# Patient Record
Sex: Male | Born: 1990
Health system: Southern US, Community
[De-identification: ages and names within clinical notes are randomized; demographics above are authoritative.]

## PROBLEM LIST (undated history)

## (undated) DIAGNOSIS — I471 Supraventricular tachycardia, unspecified: Secondary | ICD-10-CM

## (undated) DIAGNOSIS — M549 Dorsalgia, unspecified: Secondary | ICD-10-CM

## (undated) DIAGNOSIS — G8929 Other chronic pain: Secondary | ICD-10-CM

## (undated) DIAGNOSIS — IMO0002 Reserved for concepts with insufficient information to code with codable children: Secondary | ICD-10-CM

## (undated) DIAGNOSIS — I493 Ventricular premature depolarization: Secondary | ICD-10-CM

## (undated) HISTORY — DX: Ventricular premature depolarization: I49.3

## (undated) HISTORY — DX: Supraventricular tachycardia: I47.1

## (undated) HISTORY — DX: Supraventricular tachycardia, unspecified: I47.10

---

## 2009-03-09 ENCOUNTER — Emergency Department (HOSPITAL_BASED_OUTPATIENT_CLINIC_OR_DEPARTMENT_OTHER): Admission: EM | Admit: 2009-03-09 | Discharge: 2009-03-10 | Payer: Self-pay | Admitting: Emergency Medicine

## 2009-03-09 ENCOUNTER — Ambulatory Visit: Payer: Self-pay | Admitting: Diagnostic Radiology

## 2009-07-18 ENCOUNTER — Other Ambulatory Visit: Payer: Self-pay | Admitting: Emergency Medicine

## 2009-07-18 ENCOUNTER — Emergency Department (HOSPITAL_COMMUNITY): Admission: EM | Admit: 2009-07-18 | Discharge: 2009-07-18 | Payer: Self-pay | Admitting: Emergency Medicine

## 2009-07-29 ENCOUNTER — Emergency Department (HOSPITAL_BASED_OUTPATIENT_CLINIC_OR_DEPARTMENT_OTHER): Admission: EM | Admit: 2009-07-29 | Discharge: 2009-07-29 | Payer: Self-pay | Admitting: Emergency Medicine

## 2009-08-18 ENCOUNTER — Emergency Department (HOSPITAL_BASED_OUTPATIENT_CLINIC_OR_DEPARTMENT_OTHER): Admission: EM | Admit: 2009-08-18 | Discharge: 2009-08-18 | Payer: Self-pay | Admitting: Emergency Medicine

## 2010-10-01 LAB — BASIC METABOLIC PANEL
BUN: 9 mg/dL (ref 6–23)
CO2: 25 mEq/L (ref 19–32)
Calcium: 9.5 mg/dL (ref 8.4–10.5)
Chloride: 104 mEq/L (ref 96–112)
Creatinine, Ser: 0.7 mg/dL (ref 0.4–1.5)
GFR calc Af Amer: >60 mL/min (ref >60)
GFR calc non Af Amer: >60 mL/min (ref >60)
Glucose, Bld: 73 mg/dL (ref 70–99)
Potassium: 4.4 mEq/L (ref 3.5–5.1)
Sodium: 141 mEq/L (ref 135–145)

## 2010-10-01 LAB — CULTURE, BLOOD (ROUTINE X 2)
Culture: NO GROWTH
Culture: NO GROWTH

## 2010-10-01 LAB — DIFFERENTIAL
Basophils Relative: 1 % (ref 0–1)
Lymphs Abs: 2.9 10*3/uL (ref 0.7–4.0)
Monocytes Absolute: 0.7 10*3/uL (ref 0.1–1.0)
Neutro Abs: 5.2 10*3/uL (ref 1.7–7.7)

## 2010-10-01 LAB — CBC
HCT: 44.8 % (ref 39.0–52.0)
MCV: 90.1 fL (ref 78.0–100.0)
Platelets: 260 10*3/uL (ref 150–400)
RBC: 4.97 MIL/uL (ref 4.22–5.81)

## 2010-10-01 LAB — SEDIMENTATION RATE: Sed Rate: 5 mm/hr (ref 0–16)

## 2010-11-12 ENCOUNTER — Emergency Department (HOSPITAL_COMMUNITY)
Admission: EM | Admit: 2010-11-12 | Discharge: 2010-11-12 | Disposition: A | Discharge status: Home / Self Care | Payer: Self-pay | Attending: Emergency Medicine | Admitting: Emergency Medicine

## 2010-11-12 DIAGNOSIS — F17200 Nicotine dependence, unspecified, uncomplicated: Secondary | ICD-10-CM | POA: Insufficient documentation

## 2010-11-12 DIAGNOSIS — K625 Hemorrhage of anus and rectum: Secondary | ICD-10-CM | POA: Insufficient documentation

## 2010-11-12 LAB — DIFFERENTIAL
Eosinophils Relative: 1 % (ref 0–5)
Lymphocytes Relative: 33 % (ref 12–46)
Monocytes Absolute: 0.6 10*3/uL (ref 0.1–1.0)

## 2010-11-12 LAB — CBC
HCT: 43.8 % (ref 39.0–52.0)
Hemoglobin: 15.6 g/dL (ref 13.0–17.0)
MCHC: 35.6 g/dL (ref 30.0–36.0)
MCV: 86.6 fL (ref 78.0–100.0)
Platelets: 224 10*3/uL (ref 150–400)
RBC: 5.06 MIL/uL (ref 4.22–5.81)

## 2010-11-12 LAB — OCCULT BLOOD, POC DEVICE: Fecal Occult Bld: NEGATIVE

## 2011-02-23 ENCOUNTER — Emergency Department (HOSPITAL_BASED_OUTPATIENT_CLINIC_OR_DEPARTMENT_OTHER)
Admission: EM | Admit: 2011-02-23 | Discharge: 2011-02-23 | Disposition: A | Discharge status: Home / Self Care | Payer: Self-pay | Attending: Emergency Medicine | Admitting: Emergency Medicine

## 2011-02-23 ENCOUNTER — Encounter: Payer: Self-pay | Admitting: *Deleted

## 2011-02-23 DIAGNOSIS — F17200 Nicotine dependence, unspecified, uncomplicated: Secondary | ICD-10-CM | POA: Insufficient documentation

## 2011-02-23 DIAGNOSIS — T6391XA Toxic effect of contact with unspecified venomous animal, accidental (unintentional), initial encounter: Secondary | ICD-10-CM | POA: Insufficient documentation

## 2011-02-23 DIAGNOSIS — T63461A Toxic effect of venom of wasps, accidental (unintentional), initial encounter: Secondary | ICD-10-CM | POA: Insufficient documentation

## 2011-02-23 DIAGNOSIS — T63441A Toxic effect of venom of bees, accidental (unintentional), initial encounter: Secondary | ICD-10-CM

## 2011-02-23 HISTORY — DX: Reserved for concepts with insufficient information to code with codable children: IMO0002

## 2011-02-23 MED ORDER — ALBUTEROL SULFATE (5 MG/ML) 0.5% IN NEBU
INHALATION_SOLUTION | RESPIRATORY_TRACT | Status: AC
  Filled 2011-02-23: qty 1

## 2011-02-23 MED ORDER — METHYLPREDNISOLONE SODIUM SUCC 125 MG IJ SOLR
INTRAMUSCULAR | Status: AC
  Filled 2011-02-23: qty 2

## 2011-02-23 MED ORDER — DIPHENHYDRAMINE HCL 50 MG/ML IJ SOLN
INTRAMUSCULAR | Status: AC
  Filled 2011-02-23: qty 1

## 2011-02-23 MED ORDER — EPINEPHRINE 0.3 MG/0.3ML IJ DEVI: 0.3000 mg | INTRAMUSCULAR | Status: DC | PRN

## 2011-02-23 MED ORDER — EPINEPHRINE HCL 1 MG/ML IJ SOLN
INTRAMUSCULAR | Status: AC
  Filled 2011-02-23: qty 1

## 2011-02-23 MED ORDER — IPRATROPIUM BROMIDE 0.02 % IN SOLN
RESPIRATORY_TRACT | Status: AC
  Filled 2011-02-23: qty 2.5

## 2011-02-23 NOTE — ED Notes (Signed)
Pt lying on stretcher talking to friend at bedside. Pt is CAO and in NAD. Pt denies return of any prior symptoms and denies difficulty swallowing or SOB.

## 2011-02-23 NOTE — ED Provider Notes (Signed)
History     CSN: [id number] Arrival date & time: 02/23/2011  2:07 AM  Chief Complaint  Patient presents with  . Insect Bite   HPI  this is a 20 year old male who was bitten by a bee just prior to arrival. He developed difficulty breathing throat swelling and itching and pain at the site. The site was on his left lateral lower leg. EMS was called and they treated him with 0.3 mg of subcutaneous epinephrine, 50 mg of Benadryl IV, 125 mg of Solu-Medrol IV, and 2 albuterol nebulizer breathing treatments. These resulted in almost complete relief of his symptoms. At this time he is asymptomatic and ready to go home. The symptoms a thorough work for moderate to severe. He requests a prescription for an epinephrine pen to carry with him.  Past Medical History  Diagnosis Date  . Herniated disc     History reviewed. No pertinent past surgical history.  No family history on file.  History  Substance Use Topics  . Smoking status: Current Everyday Smoker  . Smokeless tobacco: Not on file  . Alcohol Use: Yes      Review of Systems  All other systems reviewed and are negative.    Physical Exam  BP 123/74  Pulse 103  Temp(Src) 98.4 F (36.9 C) (Oral)  Resp 20  Ht 6\' 1"  (1.854 m)  Wt 201 lb (91.173 kg)  BMI 26.52 kg/m2  SpO2 99%  Physical Exam General: Well-developed, well-nourished male in no acute distress; appearance consistent with age of record HENT: normocephalic, atraumatic; oropharynx normal Eyes: pupils equal round and reactive to light; extraocular muscles intact Neck: supple Heart: regular rate and rhythm; no murmurs, rubs or gallops Lungs: clear to auscultation bilaterally Abdomen: soft; nontender; nondistended; no masses or hepatosplenomegaly; bowel sounds present Extremities: No deformity; full range of motion; pulses normal Neurologic: Awake, alert and oriented;motor function intact in all extremities and symmetric;sensation grossly intact; no facial droop Skin:  Warm and dry Psychiatric: Normal mood and affect   ED Course  Procedures  MDM       Hanley Seamen, MD 02/23/11 [phone]

## 2011-02-23 NOTE — ED Notes (Signed)
Pt was on his front porch smoking and swatted at an insect on his leg that then bit/stung him. EMS sts upon their arrival, pt was lying in the floor nearly unresponsive and wheezing. EMS placed an 18 ga in his LAC and administered 0.3mg  Epinephrine, breathing treatments x2, 50mg  Benadryl, 125mg  Solu-medrol and 50mg  Zantac. Pt CAO and in no distress upon arrival to the ER.

## 2011-06-04 ENCOUNTER — Encounter (HOSPITAL_BASED_OUTPATIENT_CLINIC_OR_DEPARTMENT_OTHER): Payer: Self-pay | Admitting: Emergency Medicine

## 2011-06-04 ENCOUNTER — Emergency Department (HOSPITAL_BASED_OUTPATIENT_CLINIC_OR_DEPARTMENT_OTHER)
Admission: EM | Admit: 2011-06-04 | Discharge: 2011-06-04 | Disposition: A | Discharge status: Home / Self Care | Payer: Self-pay | Attending: Emergency Medicine | Admitting: Emergency Medicine

## 2011-06-04 DIAGNOSIS — J111 Influenza due to unidentified influenza virus with other respiratory manifestations: Secondary | ICD-10-CM | POA: Insufficient documentation

## 2011-06-04 DIAGNOSIS — R059 Cough, unspecified: Secondary | ICD-10-CM | POA: Insufficient documentation

## 2011-06-04 DIAGNOSIS — R05 Cough: Secondary | ICD-10-CM | POA: Insufficient documentation

## 2011-06-04 DIAGNOSIS — R509 Fever, unspecified: Secondary | ICD-10-CM | POA: Insufficient documentation

## 2011-06-04 LAB — RAPID STREP SCREEN (MED CTR MEBANE ONLY): Streptococcus, Group A Screen (Direct): NEGATIVE

## 2011-06-04 MED ORDER — ACETAMINOPHEN 325 MG PO TABS
650.0000 mg | ORAL_TABLET | Freq: Once | ORAL | Status: AC
  Administered 2011-06-04: 650 mg via ORAL
  Filled 2011-06-04: qty 2

## 2011-06-04 NOTE — ED Notes (Signed)
Dr. Wickline at bedside.  

## 2011-06-04 NOTE — ED Notes (Signed)
Pt reports sore throat/nasal congestion/sinus headache/generalized body aches/abdominal pains with vomiting since last night at 3am. Sudden onset. Progressively worsening throughout the day. Taking ibuprofen without relief of aches. Fever as high as 101.8 at home. Pt states it started with the sore throat first.

## 2011-06-04 NOTE — ED Notes (Signed)
Pt with fever, cough and vomiting.

## 2011-06-05 NOTE — ED Provider Notes (Signed)
History     CSN: [id number] Arrival date & time: 06/04/2011 10:55 PM   First MD Initiated Contact with Patient 06/04/11 2330      Chief Complaint  Patient presents with  . Fever  . Emesis  . Cough     Patient is a 20 y.o. male presenting with fever, vomiting, and cough. The history is provided by the patient.  Fever Primary symptoms of the febrile illness include fever, fatigue, headaches, cough, nausea, vomiting and myalgias. Primary symptoms do not include shortness of breath, altered mental status or rash. The current episode started yesterday. This is a new problem. The problem has been gradually worsening.  Associated with: nothing. Risk factors: none. Emesis  Associated symptoms include cough, a fever, headaches and myalgias.  Cough Associated symptoms include headaches and myalgias. Pertinent negatives include no shortness of breath.    Past Medical History  Diagnosis Date  . Herniated disc     History reviewed. No pertinent past surgical history.  No family history on file.  History  Substance Use Topics  . Smoking status: Current Everyday Smoker  . Smokeless tobacco: Not on file  . Alcohol Use: Yes      Review of Systems  Constitutional: Positive for fever and fatigue.  Respiratory: Positive for cough. Negative for shortness of breath.   Gastrointestinal: Positive for nausea and vomiting.  Musculoskeletal: Positive for myalgias.  Skin: Negative for rash.  Neurological: Positive for headaches.  Psychiatric/Behavioral: Negative for altered mental status.    Allergies  Bee venom and Pineapple  Home Medications   Current Outpatient Rx  Name Route Sig Dispense Refill  . ASPIRIN 325 MG PO TABS Oral Take 650 mg by mouth once.      Dorothyann Peng MULTI-SYMPTOM COLD/FLU PO Oral Take 2 capsules by mouth every 6 (six) hours as needed. For congestion     . EPINEPHRINE 0.3 MG/0.3ML IJ DEVI Intramuscular Inject 0.3 mg into the muscle as needed. For allergic  reaction       BP 129/71  Pulse 115  Temp(Src) 99.8 F (37.7 C) (Oral)  Resp 18  SpO2 96%  Physical Exam  CONSTITUTIONAL: Well developed/well nourished HEAD AND FACE: Normocephalic/atraumatic EYES: EOMI/PERRL ENMT: Mucous membranes moist, uvula midline, pharynx normal NECK: supple no meningeal signs CV: S1/S2 noted, no murmurs/rubs/gallops noted LUNGS: Lungs are clear to auscultation bilaterally, no apparent distress ABDOMEN: soft, nontender, no rebound or guarding NEURO: Pt is awake/alert, moves all extremitiesx4 EXTREMITIES: pulses normal, full ROM SKIN: warm, color normal PSYCH: no abnormalities of mood noted   ED Course  Procedures    Labs Reviewed  RAPID STREP SCREEN     1. Influenza       MDM  Nursing notes reviewed and considered in documentation All labs/vitals reviewed and considered   Pt with fever/cough/myalgias, but well appearing, no distress. Suspect influenza.  Stable for outpatient management        Joya Gaskins, MD 06/05/11 0126

## 2011-06-07 ENCOUNTER — Emergency Department (HOSPITAL_BASED_OUTPATIENT_CLINIC_OR_DEPARTMENT_OTHER)
Admission: EM | Admit: 2011-06-07 | Discharge: 2011-06-07 | Disposition: A | Discharge status: Home / Self Care | Payer: Self-pay | Attending: Emergency Medicine | Admitting: Emergency Medicine

## 2011-06-07 ENCOUNTER — Emergency Department (INDEPENDENT_AMBULATORY_CARE_PROVIDER_SITE_OTHER): Payer: Self-pay

## 2011-06-07 ENCOUNTER — Encounter (HOSPITAL_BASED_OUTPATIENT_CLINIC_OR_DEPARTMENT_OTHER): Payer: Self-pay | Admitting: *Deleted

## 2011-06-07 DIAGNOSIS — J189 Pneumonia, unspecified organism: Secondary | ICD-10-CM | POA: Insufficient documentation

## 2011-06-07 DIAGNOSIS — R509 Fever, unspecified: Secondary | ICD-10-CM

## 2011-06-07 DIAGNOSIS — R042 Hemoptysis: Secondary | ICD-10-CM

## 2011-06-07 DIAGNOSIS — F17200 Nicotine dependence, unspecified, uncomplicated: Secondary | ICD-10-CM | POA: Insufficient documentation

## 2011-06-07 MED ORDER — HYDROCOD POLST-CHLORPHEN POLST 10-8 MG/5ML PO LQCR: 5.0000 mL | Freq: Two times a day (BID) | ORAL | Status: DC | PRN

## 2011-06-07 MED ORDER — AZITHROMYCIN 250 MG PO TABS: 250.0000 mg | ORAL_TABLET | Freq: Every day | ORAL | Status: AC

## 2011-06-07 NOTE — ED Notes (Signed)
Patient transported to X-ray 

## 2011-06-07 NOTE — ED Notes (Signed)
Pt c/o severe coughing seen here Monday dx flu

## 2011-06-07 NOTE — ED Provider Notes (Signed)
History     CSN: [id number] Arrival date & time: 06/07/2011  5:42 PM   First MD Initiated Contact with Patient 06/07/11 1744      Chief Complaint  Patient presents with  . Cough    (Consider location/radiation/quality/duration/timing/severity/associated sxs/prior treatment) HPI Comments: Pt states that he was seen 3 days ago and was told that he had the flu:pt states that he hasn't had a fever since yesterday  Patient is a 20 y.o. male presenting with cough. The history is provided by the patient. No language interpreter was used.  Cough This is a new problem. The current episode started more than 2 days ago. The problem occurs constantly. The problem has not changed since onset.The cough is productive of blood-tinged sputum. The maximum temperature recorded prior to his arrival was 101 to 101.9 F. The fever has been present for 3 to 4 days. Associated symptoms include myalgias. Pertinent negatives include no headaches, no sore throat, no shortness of breath and no wheezing. He is a smoker.    Past Medical History  Diagnosis Date  . Herniated disc     History reviewed. No pertinent past surgical history.  History reviewed. No pertinent family history.  History  Substance Use Topics  . Smoking status: Current Everyday Smoker  . Smokeless tobacco: Not on file  . Alcohol Use: Yes      Review of Systems  HENT: Negative for sore throat.   Respiratory: Positive for cough. Negative for shortness of breath and wheezing.   Musculoskeletal: Positive for myalgias.  Neurological: Negative for headaches.  All other systems reviewed and are negative.    Allergies  Bee venom and Pineapple  Home Medications   Current Outpatient Rx  Name Route Sig Dispense Refill  . IBUPROFEN 200 MG PO TABS Oral Take 400 mg by mouth every 6 (six) hours as needed. For pain      . EPINEPHRINE 0.3 MG/0.3ML IJ DEVI Intramuscular Inject 0.3 mg into the muscle as needed. For allergic reaction        Pulse 101  Temp(Src) 98.6 F (37 C) (Oral)  Resp 16  SpO2 98%  Physical Exam  Nursing note and vitals reviewed. Constitutional: He appears well-developed and well-nourished.  HENT:  Head: Normocephalic and atraumatic.  Right Ear: External ear normal.  Left Ear: External ear normal.  Mouth/Throat: Oropharynx is clear and moist.  Eyes: Conjunctivae are normal.  Cardiovascular: Normal rate and regular rhythm.   Pulmonary/Chest: Effort normal and breath sounds normal.  Abdominal: Soft. Bowel sounds are normal.  Musculoskeletal: Normal range of motion.  Neurological: He is alert.  Skin: Skin is warm and dry.    ED Course  Procedures (including critical care time)  Labs Reviewed - No data to display Dg Chest 2 View  06/07/2011  *RADIOLOGY REPORT*  Clinical Data: 20 year old male with hemoptysis, fever.  CHEST - 2 VIEW  Comparison: None.  Findings: Normal lung volumes. Normal cardiac size and mediastinal contours.  Visualized tracheal air column is within normal limits. Subtle increased peribronchovascular opacity in the mid right lung. Otherwise lung parenchyma is within normal limits.  No pneumothorax or effusion. No acute osseous abnormality identified.  IMPRESSION: Subtle evidence of right mid lung infectious or inflammatory process, otherwise negative chest.  Original Report Authenticated By: Harley Hallmark, M.D.     1. Pneumonia       MDM  Will treat pt for pneumonia        Teressa Lower, NP 06/07/11 1926

## 2011-06-08 NOTE — ED Provider Notes (Signed)
Medical screening examination/treatment/procedure(s) were performed by non-physician practitioner and as supervising physician I was immediately available for consultation/collaboration.   Tehila Sokolow, MD 06/08/11 0012 

## 2011-06-29 ENCOUNTER — Emergency Department (HOSPITAL_BASED_OUTPATIENT_CLINIC_OR_DEPARTMENT_OTHER)
Admission: EM | Admit: 2011-06-29 | Discharge: 2011-06-29 | Disposition: A | Discharge status: Home / Self Care | Payer: Self-pay | Attending: Emergency Medicine | Admitting: Emergency Medicine

## 2011-06-29 ENCOUNTER — Encounter (HOSPITAL_BASED_OUTPATIENT_CLINIC_OR_DEPARTMENT_OTHER): Payer: Self-pay | Admitting: Emergency Medicine

## 2011-06-29 DIAGNOSIS — F17200 Nicotine dependence, unspecified, uncomplicated: Secondary | ICD-10-CM | POA: Insufficient documentation

## 2011-06-29 DIAGNOSIS — R21 Rash and other nonspecific skin eruption: Secondary | ICD-10-CM | POA: Insufficient documentation

## 2011-06-29 DIAGNOSIS — T7840XA Allergy, unspecified, initial encounter: Secondary | ICD-10-CM

## 2011-06-29 MED ORDER — EPINEPHRINE 0.3 MG/0.3ML IJ DEVI: 0.3000 mg | INTRAMUSCULAR | Status: DC | PRN

## 2011-06-29 MED ORDER — FAMOTIDINE 20 MG PO TABS
40.0000 mg | ORAL_TABLET | Freq: Once | ORAL | Status: AC
  Administered 2011-06-29: 40 mg via ORAL
  Filled 2011-06-29: qty 2

## 2011-06-29 MED ORDER — DEXAMETHASONE 4 MG PO TABS
10.0000 mg | ORAL_TABLET | Freq: Once | ORAL | Status: AC
  Administered 2011-06-29: 10 mg via ORAL
  Filled 2011-06-29: qty 3

## 2011-06-29 NOTE — ED Notes (Signed)
Pt reports accidentally eating pineapple tonight, pt reports itching all over and scratchy throat.

## 2011-06-29 NOTE — ED Notes (Signed)
Pt took 50 mg benadryl 30 min PTA

## 2011-06-29 NOTE — ED Provider Notes (Signed)
History     CSN: [id number] Arrival date & time: No admission date for patient encounter.   None     Chief Complaint  Patient presents with  . Allergic Reaction    (Consider location/radiation/quality/duration/timing/severity/associated sxs/prior treatment) HPI This is a 20 year old white male with a history of pathologic. He ate ham about an hour ago that apparently had pineapple and it. He very quickly developed generalized rash and itching, and throat tightening and scratchiness, wheezing and nausea but no vomiting or diarrhea. He used an EpiPen and took 50 mg of Benadryl with near-complete resolution of symptoms. The symptoms were moderate to severe at their worst. Some itching remains.  Past Medical History  Diagnosis Date  . Herniated disc     History reviewed. No pertinent past surgical history.  No family history on file.  History  Substance Use Topics  . Smoking status: Current Everyday Smoker  . Smokeless tobacco: Not on file  . Alcohol Use: Yes      Review of Systems  All other systems reviewed and are negative.    Allergies  Bee venom and Pineapple  Home Medications   Current Outpatient Rx  Name Route Sig Dispense Refill  . HYDROCOD POLST-CHLORPHEN POLST 10-8 MG/5ML PO LQCR Oral Take 5 mLs by mouth every 12 (twelve) hours as needed. 140 mL 0  . EPINEPHRINE 0.3 MG/0.3ML IJ DEVI Intramuscular Inject 0.3 mg into the muscle as needed. For allergic reaction     . IBUPROFEN 200 MG PO TABS Oral Take 400 mg by mouth every 6 (six) hours as needed. For pain        BP 124/89  Pulse 99  Temp(Src) 97.8 F (36.6 C) (Oral)  Resp 18  SpO2 99%  Physical Exam General: Well-developed, well-nourished male in no acute distress; appearance consistent with age of record HENT: normocephalic, atraumatic; mild edema of uvula Eyes: pupils equal round and reactive to light; extraocular muscles intact Neck: supple Heart: regular rate and rhythm Lungs: clear to  auscultation bilaterally Abdomen: soft; nontender; nondistended Extremities: No deformity; full range of motion; pulses normal Neurologic: Awake, alert and oriented; motor function intact in all extremities and symmetric; no facial droop Skin: Warm and dry; mild facial erythema Psychiatric: Normal mood and affect    ED Course  Procedures (including critical care time)   MDM          Hanley Seamen, MD 06/29/11 [phone]

## 2011-06-29 NOTE — ED Notes (Signed)
Pt also used epi-pen 30 min PTA

## 2011-06-29 NOTE — ED Notes (Signed)
Pt reports symptoms are improving since taking meds.

## 2012-01-28 ENCOUNTER — Emergency Department (HOSPITAL_BASED_OUTPATIENT_CLINIC_OR_DEPARTMENT_OTHER): Payer: Self-pay

## 2012-01-28 ENCOUNTER — Emergency Department (HOSPITAL_BASED_OUTPATIENT_CLINIC_OR_DEPARTMENT_OTHER)
Admission: EM | Admit: 2012-01-28 | Discharge: 2012-01-28 | Disposition: A | Discharge status: Home / Self Care | Payer: Self-pay | Attending: Emergency Medicine | Admitting: Emergency Medicine

## 2012-01-28 ENCOUNTER — Encounter (HOSPITAL_BASED_OUTPATIENT_CLINIC_OR_DEPARTMENT_OTHER): Payer: Self-pay | Admitting: *Deleted

## 2012-01-28 DIAGNOSIS — M25569 Pain in unspecified knee: Secondary | ICD-10-CM | POA: Insufficient documentation

## 2012-01-28 DIAGNOSIS — S86819A Strain of other muscle(s) and tendon(s) at lower leg level, unspecified leg, initial encounter: Secondary | ICD-10-CM | POA: Insufficient documentation

## 2012-01-28 DIAGNOSIS — S838X9A Sprain of other specified parts of unspecified knee, initial encounter: Secondary | ICD-10-CM | POA: Insufficient documentation

## 2012-01-28 DIAGNOSIS — F17200 Nicotine dependence, unspecified, uncomplicated: Secondary | ICD-10-CM | POA: Insufficient documentation

## 2012-01-28 DIAGNOSIS — Y9361 Activity, american tackle football: Secondary | ICD-10-CM | POA: Insufficient documentation

## 2012-01-28 DIAGNOSIS — X58XXXA Exposure to other specified factors, initial encounter: Secondary | ICD-10-CM | POA: Insufficient documentation

## 2012-01-28 DIAGNOSIS — M25462 Effusion, left knee: Secondary | ICD-10-CM

## 2012-01-28 DIAGNOSIS — S8390XA Sprain of unspecified site of unspecified knee, initial encounter: Secondary | ICD-10-CM

## 2012-01-28 MED ORDER — OXYCODONE-ACETAMINOPHEN 5-325 MG PO TABS
2.0000 | ORAL_TABLET | Freq: Once | ORAL | Status: AC
  Administered 2012-01-28: 2 via ORAL
  Filled 2012-01-28: qty 2

## 2012-01-28 MED ORDER — IBUPROFEN 600 MG PO TABS: 600.0000 mg | ORAL_TABLET | Freq: Three times a day (TID) | ORAL | Status: AC

## 2012-01-28 MED ORDER — OXYCODONE-ACETAMINOPHEN 5-325 MG PO TABS: ORAL_TABLET | ORAL | Status: AC

## 2012-01-28 NOTE — ED Notes (Signed)
C/o left knee pain x 1 week after playing football- "knee went back like a flamingo"- pain with walking

## 2012-01-28 NOTE — Discharge Instructions (Signed)
Knee Sprain You have a knee sprain. Sprains are painful injuries to the joints. A sprain is a partial or complete tearing of ligaments. Ligaments are tough, fibrous tissues that hold bones together at the joints. A strain (sprain) has occurred when a ligament is stretched or damaged. This injury may take several weeks to heal. This is often the same length of time as a bone fracture (break in bone) takes to heal. Even though a fracture (bone break) may not have occurred, the recovery times may be similar. HOME CARE INSTRUCTIONS   Rest the injured area for as long as directed by your caregiver. Then slowly start using the joint as directed by your caregiver and as the pain allows. Use crutches as directed. If the knee was splinted or casted, continue use and care as directed. If an ace bandage has been applied today, it should be removed and reapplied every 3 to 4 hours. It should not be applied tightly, but firmly enough to keep swelling down. Watch toes and feet for swelling, bluish discoloration, coldness, numbness or excessive pain. If any of these symptoms occur, remove the ace bandage and reapply more loosely.If these symptoms persist, seek medical attention.   For the first 24 hours, lie down. Keep the injured extremity elevated on two pillows.   Apply ice to the injured area for 15 to 20 minutes every couple hours. Repeat this 3 to 4 times per day for the first 48 hours. Put the ice in a plastic bag and place a towel between the bag of ice and your skin.   Wear any splinting, casting, or elastic bandage applications as instructed.   Only take over-the-counter or prescription medicines for pain, discomfort, or fever as directed by your caregiver. Do not use aspirin immediately after the injury unless instructed by your caregiver. Aspirin can cause increased bleeding and bruising of the tissues.   If you were given crutches, continue to use them as instructed. Do not resume weight bearing on the  affected extremity until instructed.  Persistent pain and inability to use the injured area as directed for more than 2 to 3 days are warning signs. If this happens you should see a caregiver for a follow-up visit as soon as possible. Initially, a hairline fracture (this is the same as a broken bone) may not be evident on x-rays. Persistent pain and swelling indicate that further evaluation, non-weight bearing (use of crutches as instructed), and/or further x-rays are indicated. X-rays may sometimes not show a small fracture until a week or ten days later. Make a follow-up appointment with your own caregiver or one to whom we have referred you. A radiologist (specialist in reading x-rays) may re-read your X-rays. Make sure you know how you are to get your x-ray results. Do not assume everything is normal if you do not hear from us. SEEK MEDICAL CARE IF:   Bruising, swelling, or pain increases.   You have cold or numb toes   You have continuing difficulty or pain with walking.  SEEK IMMEDIATE MEDICAL CARE IF:   Your toes are cold, numb or blue.   The pain is not responding to medications and continues to stay the same or get worse.  MAKE SURE YOU:   Understand these instructions.   Will watch your condition.   Will get help right away if you are not doing well or get worse.  Document Released: 07/02/2005 Document Revised: 06/21/2011 Document Reviewed: 06/16/2007 [employer] Patient Information 2012 [employer].      Narcotic and benzodiazepine use may cause drowsiness, slowed breathing or dependence.  Please use with caution and do not drive, operate machinery or watch young children alone while taking them.  Taking combinations of these medications or drinking alcohol will potentiate these effects.    

## 2012-01-28 NOTE — ED Provider Notes (Signed)
History  This chart was scribed for Glenn Schneider. Glenn Lamas, MD by Bennett Scrape. This patient was seen in room MH06/MH06 and the patient's care was started at 6:45PM.  CSN: [id number]  Arrival date & time 01/28/12  1804   First MD Initiated Contact with Patient 01/28/12 1845      Chief Complaint  Patient presents with  . Knee Injury     The history is provided by the patient. No language interpreter was used.    Glenn Schneider is a 21 y.o. male who presents to the Emergency Department complaining of one week of sudden onset, gradually worsening, constant left knee pain after playing football. Pt states that during a play he was hit in the knee forcing it backwards. He has been ambulating on it but reports increased pain with movement and walking. He also states that he keeps falling down. He reports taking ibuprofen with mild improvement in pain. He reports one prior episode of similar symptoms when a tree limb forced his knee backwards several years ago but did not need surgery. He has a h/o herniated disc. He is a current everyday smoker and occasional alcohol user.   Past Medical History  Diagnosis Date  . Herniated disc     History reviewed. No pertinent past surgical history.  History reviewed. No pertinent family history.  History  Substance Use Topics  . Smoking status: Current Everyday Smoker  . Smokeless tobacco: Former Neurosurgeon  . Alcohol Use: 0.6 oz/week    1 Shots of liquor per week      Review of Systems  Constitutional: Negative for fever and chills.  Gastrointestinal: Negative for nausea and vomiting.  Musculoskeletal: Negative for back pain.       Left knee pain    Allergies  Bee venom; Pineapple; and Toradol  Home Medications   Current Outpatient Rx  Name Route Sig Dispense Refill  . IBUPROFEN 200 MG PO TABS Oral Take 400 mg by mouth every 6 (six) hours as needed. For pain    . IBUPROFEN 600 MG PO TABS Oral Take 1 tablet (600 mg total) by mouth 3 (three)  times daily. Take with food 21 tablet 0  . OXYCODONE-ACETAMINOPHEN 5-325 MG PO TABS  1-2 tablets po q 6 hours prn moderate to severe pain 20 tablet 0    Triage Vitals: BP 148/83  Pulse 88  Temp 98.4 F (36.9 C) (Oral)  Resp 18  SpO2 98%  Physical Exam  Nursing note and vitals reviewed. Constitutional: He is oriented to person, place, and time. He appears well-developed and well-nourished. No distress.  HENT:  Head: Normocephalic and atraumatic.  Eyes: EOM are normal.  Neck: Neck supple. No tracheal deviation present.  Cardiovascular: Normal rate.   Pulmonary/Chest: Effort normal. No respiratory distress.  Musculoskeletal:       Pain with lateral stress and some with medial stress of the left knee, no big effusion felt in the left knee, no discoloration, no rash, no bony tenderness, no crepitance, no major laxity   Neurological: He is alert and oriented to person, place, and time.  Skin: Skin is warm and dry.  Psychiatric: He has a normal mood and affect. His behavior is normal.    ED Course  Procedures (including critical care time)  DIAGNOSTIC STUDIES: Oxygen Saturation is 98% on room air, normal by my interpretation.    COORDINATION OF CARE: 7:00PM-Discussed treatment plan which includes a knee brace, crutches, pain medications and an orthopedist follow up with pt at  bedside and pt agreed to plan.   Labs Reviewed - No data to display No results found.   1. Knee sprain   2. Knee effusion, left       MDM  I personally performed the services described in this documentation, which was scribed in my presence. The recorded information has been reviewed and considered.  Intact distal neurovascular, no obv deformity, has been bearing weight.  Not too clinically suspicious for acute ligament tear, however his history strongly suggests that he may have strained or tore a ligament.  Refer to orthopedist for follow up.  RICE at home, knee brace and crutches ordered.            Glenn Schneider. Danthony Kendrix, MD 01/31/12 2240

## 2012-01-31 ENCOUNTER — Encounter (HOSPITAL_BASED_OUTPATIENT_CLINIC_OR_DEPARTMENT_OTHER): Payer: Self-pay | Admitting: Emergency Medicine

## 2012-02-03 DIAGNOSIS — B86 Scabies: Secondary | ICD-10-CM | POA: Insufficient documentation

## 2012-02-03 DIAGNOSIS — F17200 Nicotine dependence, unspecified, uncomplicated: Secondary | ICD-10-CM | POA: Insufficient documentation

## 2012-02-04 ENCOUNTER — Emergency Department (HOSPITAL_BASED_OUTPATIENT_CLINIC_OR_DEPARTMENT_OTHER)
Admission: EM | Admit: 2012-02-04 | Discharge: 2012-02-04 | Disposition: A | Discharge status: Home / Self Care | Payer: Self-pay | Attending: Emergency Medicine | Admitting: Emergency Medicine

## 2012-02-04 DIAGNOSIS — B86 Scabies: Secondary | ICD-10-CM

## 2012-02-04 NOTE — ED Provider Notes (Addendum)
Documentation performed on downtime forms.  Glenn Seamen, MD 02/04/12 0309  Glenn Beers [name], MD 02/04/12 1610

## 2012-04-02 ENCOUNTER — Emergency Department (HOSPITAL_BASED_OUTPATIENT_CLINIC_OR_DEPARTMENT_OTHER)
Admission: EM | Admit: 2012-04-02 | Discharge: 2012-04-02 | Disposition: A | Discharge status: Home / Self Care | Payer: Self-pay | Attending: Emergency Medicine | Admitting: Emergency Medicine

## 2012-04-02 ENCOUNTER — Emergency Department (HOSPITAL_BASED_OUTPATIENT_CLINIC_OR_DEPARTMENT_OTHER): Payer: Self-pay

## 2012-04-02 ENCOUNTER — Encounter (HOSPITAL_BASED_OUTPATIENT_CLINIC_OR_DEPARTMENT_OTHER): Payer: Self-pay

## 2012-04-02 DIAGNOSIS — Z91038 Other insect allergy status: Secondary | ICD-10-CM | POA: Insufficient documentation

## 2012-04-02 DIAGNOSIS — T148XXA Other injury of unspecified body region, initial encounter: Secondary | ICD-10-CM

## 2012-04-02 DIAGNOSIS — W230XXA Caught, crushed, jammed, or pinched between moving objects, initial encounter: Secondary | ICD-10-CM | POA: Insufficient documentation

## 2012-04-02 DIAGNOSIS — B353 Tinea pedis: Secondary | ICD-10-CM | POA: Insufficient documentation

## 2012-04-02 DIAGNOSIS — S6000XA Contusion of unspecified finger without damage to nail, initial encounter: Secondary | ICD-10-CM | POA: Insufficient documentation

## 2012-04-02 DIAGNOSIS — F17200 Nicotine dependence, unspecified, uncomplicated: Secondary | ICD-10-CM | POA: Insufficient documentation

## 2012-04-02 MED ORDER — HYDROCODONE-ACETAMINOPHEN 5-325 MG PO TABS
2.0000 | ORAL_TABLET | Freq: Once | ORAL | Status: AC
  Administered 2012-04-02: 2 via ORAL
  Filled 2012-04-02: qty 2

## 2012-04-02 MED ORDER — HYDROCODONE-ACETAMINOPHEN 5-325 MG PO TABS: 2.0000 | ORAL_TABLET | ORAL | Status: DC | PRN

## 2012-04-02 MED ORDER — BACITRACIN 500 UNIT/GM EX OINT
1.0000 "application " | TOPICAL_OINTMENT | Freq: Once | CUTANEOUS | Status: AC
  Administered 2012-04-02: 1 via TOPICAL
  Filled 2012-04-02: qty 0.9

## 2012-04-02 NOTE — ED Notes (Signed)
Closed left thumb in car door approx 30 min PTA

## 2012-04-02 NOTE — ED Provider Notes (Signed)
History     CSN: [id number]  Arrival date & time 04/02/12  4259   First MD Initiated Contact with Patient 04/02/12 1844      Chief Complaint  Patient presents with  . Hand Injury    (Consider location/radiation/quality/duration/timing/severity/associated sxs/prior treatment) HPI Patient closed his left thumb in a car door one hour prior to coming here. Complains of pain at left thumb no other injury no other complaint no treatment prior to coming here pain is worse with pressing on the air are moving his thumb. No other associated symptoms Past Medical History  Diagnosis Date  . Herniated disc     History reviewed. No pertinent past surgical history.  No family history on file.  History  Substance Use Topics  . Smoking status: Current Every Day Smoker  . Smokeless tobacco: Former Neurosurgeon  . Alcohol Use: 0.0 oz/week      Review of Systems  Constitutional: Negative.   Musculoskeletal:       Left thumb trauma  Skin: Positive for rash.       Complains of pruritic rash on right foot for several days  Neurological: Negative.   Hematological: Negative.   Psychiatric/Behavioral: Negative.     Allergies  Bee venom; Pineapple; and Toradol  Home Medications   Current Outpatient Rx  Name Route Sig Dispense Refill  . DAYQUIL PO Oral Take 1 capsule by mouth daily as needed. For cold symptoms.      BP 145/93  Pulse 84  Temp 97.6 F (36.4 C) (Oral)  Resp 20  Ht 6\' 1"  (1.854 m)  Wt 224 lb (101.606 kg)  BMI 29.55 kg/m2  SpO2 100%  Physical Exam  Nursing note and vitals reviewed. Constitutional: He appears well-developed and well-nourished. He appears distressed.       Appears uncomfortable  HENT:  Head: Normocephalic and atraumatic.  Eyes: EOM are normal.  Neck: Neck supple.  Cardiovascular: Normal rate.   Pulmonary/Chest: No respiratory distress.  Abdominal: He exhibits no distension.  Musculoskeletal:       Left thumb with slight bleeding at nail margin nail  is intact no subungual hematoma the skin is intact tender at distal phalanx limited flexion due to pain no soft tissue swelling good capillary refill  Skin: Rash noted.       Scaly pinkish pruritic rash of dorsum of right foot consistent with tinea pedis    ED Course  Procedures (including critical care time)  Labs Reviewed - No data to display Dg Finger Thumb Left  04/02/2012  *RADIOLOGY REPORT*  Clinical Data: Left thumb pain and bleeding, smashed in car door  LEFT THUMB 2+V  Comparison: None.  Findings: Osseous mineralization normal. Joint spaces preserved. No definite fracture, dislocation, or bone destruction.  IMPRESSION: No acute osseous abnormalities.   Original Report Authenticated By: Lollie Marrow, M.D.    X-ray reviewed by me Irrigated copiously under the faucet with tap water  Medical decision making :baitracin ointment bulky dressing Hand surgeon referral if needed 3 days Prescription Norco, topical treatment for tinea pedis  diagnosis #1 contusion of left thumb Diagnosis#2 tinea pedis   No diagnosis found.    MDM      See above for medical decision-making    Doug Sou, MD 04/02/12 1947

## 2012-07-24 ENCOUNTER — Encounter (HOSPITAL_BASED_OUTPATIENT_CLINIC_OR_DEPARTMENT_OTHER): Payer: Self-pay

## 2012-07-24 ENCOUNTER — Emergency Department (HOSPITAL_BASED_OUTPATIENT_CLINIC_OR_DEPARTMENT_OTHER): Payer: Self-pay

## 2012-07-24 ENCOUNTER — Emergency Department (HOSPITAL_BASED_OUTPATIENT_CLINIC_OR_DEPARTMENT_OTHER)
Admission: EM | Admit: 2012-07-24 | Discharge: 2012-07-24 | Disposition: A | Discharge status: Home / Self Care | Payer: Self-pay | Attending: Emergency Medicine | Admitting: Emergency Medicine

## 2012-07-24 DIAGNOSIS — R042 Hemoptysis: Secondary | ICD-10-CM | POA: Insufficient documentation

## 2012-07-24 DIAGNOSIS — Y939 Activity, unspecified: Secondary | ICD-10-CM | POA: Insufficient documentation

## 2012-07-24 DIAGNOSIS — Y92009 Unspecified place in unspecified non-institutional (private) residence as the place of occurrence of the external cause: Secondary | ICD-10-CM | POA: Insufficient documentation

## 2012-07-24 DIAGNOSIS — F17200 Nicotine dependence, unspecified, uncomplicated: Secondary | ICD-10-CM | POA: Insufficient documentation

## 2012-07-24 DIAGNOSIS — Z79899 Other long term (current) drug therapy: Secondary | ICD-10-CM | POA: Insufficient documentation

## 2012-07-24 DIAGNOSIS — T65891A Toxic effect of other specified substances, accidental (unintentional), initial encounter: Secondary | ICD-10-CM | POA: Insufficient documentation

## 2012-07-24 DIAGNOSIS — R0602 Shortness of breath: Secondary | ICD-10-CM | POA: Insufficient documentation

## 2012-07-24 DIAGNOSIS — IMO0002 Reserved for concepts with insufficient information to code with codable children: Secondary | ICD-10-CM | POA: Insufficient documentation

## 2012-07-24 LAB — CBC WITH DIFFERENTIAL/PLATELET
Basophils Absolute: 0 10*3/uL (ref 0.0–0.1)
Basophils Relative: 0 % (ref 0–1)
Eosinophils Absolute: 0.3 10*3/uL (ref 0.0–0.7)
Eosinophils Relative: 3 % (ref 0–5)
HCT: 44.5 % (ref 39.0–52.0)
Lymphocytes Relative: 35 % (ref 12–46)
MCH: 31 pg (ref 26.0–34.0)
MCHC: 35.5 g/dL (ref 30.0–36.0)
MCV: 87.3 fL (ref 78.0–100.0)
Monocytes Absolute: 0.8 10*3/uL (ref 0.1–1.0)
Platelets: 211 10*3/uL (ref 150–400)
RDW: 12.9 % (ref 11.5–15.5)
WBC: 10.4 10*3/uL (ref 4.0–10.5)

## 2012-07-24 MED ORDER — BENZONATATE 100 MG PO CAPS: 100.0000 mg | ORAL_CAPSULE | Freq: Three times a day (TID) | ORAL | Status: DC

## 2012-07-24 NOTE — ED Provider Notes (Signed)
History     CSN: [id number] Arrival date & time 07/24/12  1928 First MD Initiated Contact with Patient 07/24/12 1942      Chief Complaint  Patient presents with  . Hemoptysis     HPI Pt thinks he inhaled fiber glass.  There was some damage to ceiling of his apartment and they were doing work on the area.  There was open fiber glass in the house.  His girlfriend and he both started coughing and noticed blood in the sputum.  He called his doctor and was told to come to the ED.  Past Medical History  Diagnosis Date  . Herniated disc     History reviewed. No pertinent past surgical history.  No family history on file.  History  Substance Use Topics  . Smoking status: Current Every Day Smoker  . Smokeless tobacco: Former Neurosurgeon  . Alcohol Use: 0.0 oz/week      Review of Systems  Constitutional: Negative for fever.  Respiratory: Positive for shortness of breath.   All other systems reviewed and are negative.    Allergies  Bee venom; Pineapple; and Toradol  Home Medications   Current Outpatient Rx  Name  Route  Sig  Dispense  Refill  . HYDROCODONE-ACETAMINOPHEN 5-325 MG PO TABS   Oral   Take 2 tablets by mouth every 4 (four) hours as needed for pain.   16 tablet   0   . DAYQUIL PO   Oral   Take 1 capsule by mouth daily as needed. For cold symptoms.           BP 126/69  Pulse 90  Temp 98.1 F (36.7 C) (Oral)  Resp 18  Ht 6\' 1"  (1.854 m)  Wt 222 lb (100.699 kg)  BMI 29.29 kg/m2  SpO2 98%  Physical Exam  Nursing note and vitals reviewed. Constitutional: He appears well-developed and well-nourished. No distress.  HENT:  Head: Normocephalic and atraumatic.  Right Ear: External ear normal.  Left Ear: External ear normal.  Eyes: Conjunctivae normal are normal. Right eye exhibits no discharge. Left eye exhibits no discharge. No scleral icterus.  Neck: Neck supple. No tracheal deviation present.  Cardiovascular: Normal rate, regular rhythm and intact  distal pulses.   Pulmonary/Chest: Effort normal and breath sounds normal. No stridor. No respiratory distress. He has no wheezes. He has no rales.  Abdominal: Soft. Bowel sounds are normal. He exhibits no distension. There is no tenderness. There is no rebound and no guarding.  Musculoskeletal: He exhibits no edema and no tenderness.  Neurological: He is alert. He has normal strength. No sensory deficit. Cranial nerve deficit:  no gross defecits noted. He exhibits normal muscle tone. He displays no seizure activity. Coordination normal.  Skin: Skin is warm and dry. No rash noted.  Psychiatric: He has a normal mood and affect.    ED Course  Procedures (including critical care time)   Labs Reviewed  CBC WITH DIFFERENTIAL   Dg Chest 2 View  07/24/2012  *RADIOLOGY REPORT*  Clinical Data: Hemoptysis  CHEST - 2 VIEW  Comparison: 06/07/2011  Findings: Cardiomediastinal silhouette is stable.  No acute infiltrate or pleural effusion.  No pulmonary edema.  Bony thorax is stable.  IMPRESSION: No active disease.   Original Report Authenticated By: Natasha Mead, M.D.      1. Hemoptysis       MDM  The patient does not have evidence of pneumonia. His low risk for PE. PERC negative.  His cardiovascular exam is  normal to  Differential possible symptoms could be related to inhalation of fiberglass particles although they're not likely to be airborne. At this time treatment would be supportive regardless. Recommend patient follow up with primary care Dr. if the symptoms persist.        Celene Kras, MD 07/24/12 2134

## 2012-07-24 NOTE — ED Notes (Signed)
C/o coughing up blood x 6-7 hours ago-pt reports possible fiberglass inhalation in apartment

## 2013-08-10 ENCOUNTER — Encounter (HOSPITAL_COMMUNITY): Payer: Self-pay | Admitting: Emergency Medicine

## 2013-08-10 ENCOUNTER — Emergency Department (HOSPITAL_COMMUNITY): Payer: Self-pay

## 2013-08-10 ENCOUNTER — Emergency Department (HOSPITAL_COMMUNITY)
Admission: EM | Admit: 2013-08-10 | Discharge: 2013-08-10 | Disposition: A | Discharge status: Home / Self Care | Payer: Self-pay | Attending: Emergency Medicine | Admitting: Emergency Medicine

## 2013-08-10 DIAGNOSIS — F17200 Nicotine dependence, unspecified, uncomplicated: Secondary | ICD-10-CM | POA: Insufficient documentation

## 2013-08-10 DIAGNOSIS — M545 Low back pain, unspecified: Secondary | ICD-10-CM | POA: Insufficient documentation

## 2013-08-10 DIAGNOSIS — G8929 Other chronic pain: Secondary | ICD-10-CM | POA: Insufficient documentation

## 2013-08-10 HISTORY — DX: Dorsalgia, unspecified: M54.9

## 2013-08-10 HISTORY — DX: Other chronic pain: G89.29

## 2013-08-10 MED ORDER — CYCLOBENZAPRINE HCL 10 MG PO TABS: 10.0000 mg | ORAL_TABLET | Freq: Three times a day (TID) | ORAL | Status: DC | PRN

## 2013-08-10 MED ORDER — CYCLOBENZAPRINE HCL 10 MG PO TABS
10.0000 mg | ORAL_TABLET | Freq: Once | ORAL | Status: AC
Start: 2013-08-10 — End: 2013-08-10
  Administered 2013-08-10: 10 mg via ORAL
  Filled 2013-08-10: qty 1

## 2013-08-10 MED ORDER — IBUPROFEN 800 MG PO TABS
800.0000 mg | ORAL_TABLET | Freq: Once | ORAL | Status: AC
  Administered 2013-08-10: 800 mg via ORAL
  Filled 2013-08-10: qty 1

## 2013-08-10 MED ORDER — IBUPROFEN 800 MG PO TABS: 800.0000 mg | ORAL_TABLET | Freq: Four times a day (QID) | ORAL | Status: DC | PRN

## 2013-08-10 NOTE — ED Provider Notes (Signed)
Medical screening examination/treatment/procedure(s) were performed by non-physician practitioner and as supervising physician I was immediately available for consultation/collaboration.  EKG Interpretation   None        Jochebed Bills F Kortney Potvin, MD 08/10/13 1934 

## 2013-08-10 NOTE — Discharge Instructions (Signed)
Back Exercises °Back exercises help treat and prevent back injuries. The goal of back exercises is to increase the strength of your abdominal and back muscles and the flexibility of your back. These exercises should be started when you no longer have back pain. Back exercises include: °· Pelvic Tilt. Lie on your back with your knees bent. Tilt your pelvis until the lower part of your back is against the floor. Hold this position 5 to 10 sec and repeat 5 to 10 times. °· Knee to Chest. Pull first 1 knee up against your chest and hold for 20 to 30 seconds, repeat this with the other knee, and then both knees. This may be done with the other leg straight or bent, whichever feels better. °· Sit-Ups or Curl-Ups. Bend your knees 90 degrees. Start with tilting your pelvis, and do a partial, slow sit-up, lifting your trunk only 30 to 45 degrees off the floor. Take at least 2 to 3 seconds for each sit-up. Do not do sit-ups with your knees out straight. If partial sit-ups are difficult, simply do the above but with only tightening your abdominal muscles and holding it as directed. °· Hip-Lift. Lie on your back with your knees flexed 90 degrees. Push down with your feet and shoulders as you raise your hips a couple inches off the floor; hold for 10 seconds, repeat 5 to 10 times. °· Back arches. Lie on your stomach, propping yourself up on bent elbows. Slowly press on your hands, causing an arch in your low back. Repeat 3 to 5 times. Any initial stiffness and discomfort should lessen with repetition over time. °· Shoulder-Lifts. Lie face down with arms beside your body. Keep hips and torso pressed to floor as you slowly lift your head and shoulders off the floor. °Do not overdo your exercises, especially in the beginning. Exercises may cause you some mild back discomfort which lasts for a few minutes; however, if the pain is more severe, or lasts for more than 15 minutes, do not continue exercises until you see your caregiver.  Improvement with exercise therapy for back problems is slow.  °See your caregivers for assistance with developing a proper back exercise program. °Document Released: 08/09/2004 Document Revised: 09/24/2011 Document Reviewed: 05/03/2011 °ExitCare® Patient Information ©2014 [employer]. ° °Back Pain, Adult °Low back pain is very common. About 1 in 5 people have back pain. The cause of low back pain is rarely dangerous. The pain often gets better over time. About half of people with a sudden onset of back pain feel better in just 2 weeks. About 8 in 10 people feel better by 6 weeks.  °CAUSES °Some common causes of back pain include: °· Strain of the muscles or ligaments supporting the spine. °· Wear and tear (degeneration) of the spinal discs. °· Arthritis. °· Direct injury to the back. °DIAGNOSIS °Most of the time, the direct cause of low back pain is not known. However, back pain can be treated effectively even when the exact cause of the pain is unknown. Answering your caregiver's questions about your overall health and symptoms is one of the most accurate ways to make sure the cause of your pain is not dangerous. If your caregiver needs more information, he or she may order lab work or imaging tests (X-rays or MRIs). However, even if imaging tests show changes in your back, this usually does not require surgery. °HOME CARE INSTRUCTIONS °For many people, back pain returns. Since low back pain is rarely dangerous, it is often a condition that people   can learn to manage on their own.  °· Remain active. It is stressful on the back to sit or stand in one place. Do not sit, drive, or stand in one place for more than 30 minutes at a time. Take short walks on level surfaces as soon as pain allows. Try to increase the length of time you walk each day. °· Do not stay in bed. Resting more than 1 or 2 days can delay your recovery. °· Do not avoid exercise or work. Your body is made to move. It is not dangerous to be active,  even though your back may hurt. Your back will likely heal faster if you return to being active before your pain is gone. °· Pay attention to your body when you  bend and lift. Many people have less discomfort when lifting if they bend their knees, keep the load close to their bodies, and avoid twisting. Often, the most comfortable positions are those that put less stress on your recovering back. °· Find a comfortable position to sleep. Use a firm mattress and lie on your side with your knees slightly bent. If you lie on your back, put a pillow under your knees. °· Only take over-the-counter or prescription medicines as directed by your caregiver. Over-the-counter medicines to reduce pain and inflammation are often the most helpful. Your caregiver may prescribe muscle relaxant drugs. These medicines help dull your pain so you can more quickly return to your normal activities and healthy exercise. °· Put ice on the injured area. °· Put ice in a plastic bag. °· Place a towel between your skin and the bag. °· Leave the ice on for 15-20 minutes, 03-04 times a day for the first 2 to 3 days. After that, ice and heat may be alternated to reduce pain and spasms. °· Ask your caregiver about trying back exercises and gentle massage. This may be of some benefit. °· Avoid feeling anxious or stressed. Stress increases muscle tension and can worsen back pain. It is important to recognize when you are anxious or stressed and learn ways to manage it. Exercise is a great option. °SEEK MEDICAL CARE IF: °· You have pain that is not relieved with rest or medicine. °· You have pain that does not improve in 1 week. °· You have new symptoms. °· You are generally not feeling well. °SEEK IMMEDIATE MEDICAL CARE IF:  °· You have pain that radiates from your back into your legs. °· You develop new bowel or bladder control problems. °· You have unusual weakness or numbness in your arms or legs. °· You develop nausea or vomiting. °· You develop  abdominal pain. °· You feel faint. °Document Released: 07/02/2005 Document Revised: 01/01/2012 Document Reviewed: 11/20/2010 °ExitCare® Patient Information ©2014 [employer]. ° °

## 2013-08-10 NOTE — Progress Notes (Signed)
[facility] provided pt with a list of primary care resources and [facility] information.  °

## 2013-08-10 NOTE — ED Notes (Signed)
Pt here for chronic back pain which is worse.  Pt states that the pain is in L4 area and pain is dull with radiation to left buttock and down left thigh.  Pt has been out of his meds for 5 days (hydrocodone, methocarbonal and cyclob.)  Pt is ambulatory in room, no weakness or numbness

## 2013-08-10 NOTE — ED Provider Notes (Signed)
CSN: [id number]     Arrival date & time 08/10/13  1207 History  This chart was scribed for non-physician practitioner, Mardee PostinFrances Maurita Havener-PA, working with Shon Batonourtney F Horton, MD by Smiley HousemanFallon Davis, ED Scribe. This patient was seen in room WTR6/WTR6 and the patient's care was started at 12:43 PM.    Chief Complaint  Patient presents with  . Back Pain   The history is provided by the patient. No language interpreter was used.   HPI Comments: Glenn Schneider is a 23 y.o. male who presents to the Emergency Department complaining of constant back pain that started about 5 days ago.  Pt states he was working in the yard, when he thought he pulled muscle.  Pt reports that he hasn't been seen since this incident occurred.  He states that the pain is worsened with movement and when sitting down.  Pt reports, "It feels like there is a feather tickling the inside of my leg."  Pt denies bowel or urinary incontinence.  Pt states that he has tried ibuprofen without relief.  Pt states that a couple of years ago he had a bulging disc and saw a chiropractor multiple times.  He denies this pain as similar.    Past Medical History  Diagnosis Date  . Herniated disc   . Chronic back pain greater than 3 months duration    History reviewed. No pertinent past surgical history. No family history on file. History  Substance Use Topics  . Smoking status: Current Every Day Smoker  . Smokeless tobacco: Former NeurosurgeonUser  . Alcohol Use: 0.0 oz/week    Review of Systems  Constitutional: Negative for fever and chills.  HENT: Negative for congestion and rhinorrhea.   Respiratory: Negative for cough and shortness of breath.   Cardiovascular: Negative for chest pain.  Gastrointestinal: Negative for nausea, vomiting, abdominal pain and diarrhea.  Genitourinary: Negative for difficulty urinating.  Musculoskeletal: Positive for back pain.  Skin: Negative for color change and rash.  Neurological: Negative for syncope, numbness and  headaches.  All other systems reviewed and are negative.    Allergies  Bee venom; Pineapple; and Toradol  Home Medications   Current Outpatient Rx  Name  Route  Sig  Dispense  Refill  . cyclobenzaprine (FLEXERIL) 10 MG tablet   Oral   Take 10 mg by mouth 3 (three) times daily as needed for muscle spasms.         Marland Kitchen. HYDROcodone-acetaminophen (NORCO/VICODIN) 5-325 MG per tablet   Oral   Take 2 tablets by mouth every 4 (four) hours as needed for pain.   16 tablet   0   . ibuprofen (ADVIL,MOTRIN) 200 MG tablet   Oral   Take 200 mg by mouth every 6 (six) hours as needed.          Triage Vitals: BP 123/74  Pulse 73  Temp(Src) 98.5 F (36.9 C)  Resp 22  Ht 6\' 1"  (1.854 m)  Wt 218 lb (98.884 kg)  BMI 28.77 kg/m2  SpO2 100% Physical Exam  Nursing note and vitals reviewed. Constitutional: He is oriented to person, place, and time. He appears well-developed and well-nourished. No distress.  HENT:  Head: Normocephalic and atraumatic.  Right Ear: External ear normal.  Left Ear: External ear normal.  Nose: Nose normal.  Mouth/Throat: Oropharynx is clear and moist. No oropharyngeal exudate.  Eyes: Conjunctivae are normal. Pupils are equal, round, and reactive to light. No scleral icterus.  Neck: No spinous process tenderness and no muscular tenderness  present.  Pulmonary/Chest: Effort normal.  Musculoskeletal:       Thoracic back: He exhibits tenderness and bony tenderness. He exhibits normal range of motion.       Lumbar back: He exhibits decreased range of motion, tenderness and bony tenderness.       Back:  Neurological: He is alert and oriented to person, place, and time. He displays no atrophy. No cranial nerve deficit or sensory deficit. He exhibits normal muscle tone. Coordination and gait normal.  Skin: Skin is warm and dry. No rash noted. No erythema. No pallor.  Psychiatric: He has a normal mood and affect. His behavior is normal. Judgment and thought content  normal.    ED Course  Procedures (including critical care time) DIAGNOSTIC STUDIES: Oxygen Saturation is 100% on RA, normal by my interpretation.    COORDINATION OF CARE: 12:46 PM-Will order lumbar spine x-ray.  Patient informed of current plan of treatment and evaluation and agrees with plan.    Labs Review Labs Reviewed - No data to display Imaging Review Dg Lumbar Spine Complete  08/10/2013   CLINICAL DATA:  Left-sided back pain  EXAM: LUMBAR SPINE - COMPLETE 4+ VIEW  COMPARISON:  MR L SPINE WO/W CM dated 07/18/2009  FINDINGS: There is no evidence of lumbar spine fracture. Alignment is normal. Intervertebral disc spaces are maintained.  IMPRESSION: Negative.   Electronically Signed   By: Elige Ko   On: 08/10/2013 13:56    EKG Interpretation   None       MDM  Low back pain  Patient here with lower back pain for the past 5 days - states no real injury to the back - denies weakness, numbness, tingling, no alarming signs for cauda equina, epidural hematoma.  Some conflicting information but patient denies any previous history of back pain or injury, though he has a history of "herniated disc".  I personally performed the services described in this documentation, which was scribed in my presence. The recorded information has been reviewed and is accurate.      Izola Price Marisue Humble, PA-C 08/10/13 1404

## 2014-07-07 ENCOUNTER — Emergency Department (HOSPITAL_COMMUNITY)
Admission: EM | Admit: 2014-07-07 | Discharge: 2014-07-07 | Disposition: A | Discharge status: Home / Self Care | Payer: Self-pay | Attending: Emergency Medicine | Admitting: Emergency Medicine

## 2014-07-07 ENCOUNTER — Encounter (HOSPITAL_COMMUNITY): Payer: Self-pay | Admitting: Emergency Medicine

## 2014-07-07 DIAGNOSIS — K088 Other specified disorders of teeth and supporting structures: Secondary | ICD-10-CM | POA: Insufficient documentation

## 2014-07-07 DIAGNOSIS — G8929 Other chronic pain: Secondary | ICD-10-CM | POA: Insufficient documentation

## 2014-07-07 DIAGNOSIS — Z720 Tobacco use: Secondary | ICD-10-CM | POA: Insufficient documentation

## 2014-07-07 DIAGNOSIS — K0889 Other specified disorders of teeth and supporting structures: Secondary | ICD-10-CM

## 2014-07-07 MED ORDER — MELOXICAM 15 MG PO TABS: 15.0000 mg | ORAL_TABLET | Freq: Every day | ORAL | Status: DC

## 2014-07-07 MED ORDER — PENICILLIN V POTASSIUM 500 MG PO TABS
500.0000 mg | ORAL_TABLET | Freq: Four times a day (QID) | ORAL | Status: AC
Start: 2014-07-07 — End: 2014-07-14

## 2014-07-07 MED ORDER — PENICILLIN V POTASSIUM 250 MG PO TABS
500.0000 mg | ORAL_TABLET | Freq: Once | ORAL | Status: AC
  Administered 2014-07-07: 500 mg via ORAL
  Filled 2014-07-07: qty 2

## 2014-07-07 MED ORDER — OXYCODONE-ACETAMINOPHEN 5-325 MG PO TABS
1.0000 | ORAL_TABLET | Freq: Once | ORAL | Status: AC
  Administered 2014-07-07: 1 via ORAL
  Filled 2014-07-07: qty 1

## 2014-07-07 NOTE — ED Provider Notes (Signed)
CSN: [id number]     Arrival date & time 07/07/14  0825 History   First MD Initiated Contact with Patient 07/07/14 [phone]     Chief Complaint  Patient presents with  . Dental Pain     (Consider location/radiation/quality/duration/timing/severity/associated sxs/prior Treatment) HPI  Glenn Schneider is a 23 y.o. male with PMH of herniated disc, chronic back pain presenting with dental pain for 3 months. Patient states he has been taking 14 ibuprofen tablets without any relief daily. He states he went to see a dentist today but is unable to remember the name. Patient states it's "first one you find on Google" she stated the dentist did not evaluate him and said that they could not help him and that [facility] he could see an oral Careers advisersurgeon. Patient believes he has and was to come in and that is cracked other teeth. Patient denies fevers, chills, nausea, vomiting, abdominal pain. No chest pain, shortness of breath, difficulty breathing. No lip, tongue, facial swelling or difficulty opening mouth. No history of diabetes. Patient states he hasn't been to the dentist in over18 years.   Past Medical History  Diagnosis Date  . Herniated disc   . Chronic back pain greater than 3 months duration    History reviewed. No pertinent past surgical history. No family history on file. History  Substance Use Topics  . Smoking status: Current Every Day Smoker  . Smokeless tobacco: Former NeurosurgeonUser  . Alcohol Use: 0.0 oz/week    Review of Systems  Constitutional: Negative for fever and chills.  HENT: Positive for dental problem. Negative for congestion and rhinorrhea.   Eyes: Negative for visual disturbance.  Respiratory: Negative for chest tightness and shortness of breath.   Cardiovascular: Negative for chest pain and palpitations.  Gastrointestinal: Negative for nausea, vomiting and diarrhea.  Skin: Negative for rash.  Neurological: Negative for weakness and headaches.       Allergies  Bee venom; Pineapple; and Toradol  Home Medications   Prior to Admission medications   Medication Sig Start Date End Date Taking? Authorizing Provider  cyclobenzaprine (FLEXERIL) 10 MG tablet Take 10 mg by mouth 3 (three) times daily as needed for muscle spasms.    Historical Provider, MD  cyclobenzaprine (FLEXERIL) 10 MG tablet Take 1 tablet (10 mg total) by mouth 3 (three) times daily as needed for muscle spasms. 08/10/13   Izola PriceFrances C. Sanford, PA-C  HYDROcodone-acetaminophen (NORCO/VICODIN) 5-325 MG per tablet Take 2 tablets by mouth every 4 (four) hours as needed for pain. 04/02/12   Doug SouSam Jacubowitz, MD  ibuprofen (ADVIL,MOTRIN) 200 MG tablet Take 200 mg by mouth every 6 (six) hours as needed (pain).     Historical Provider, MD  ibuprofen (ADVIL,MOTRIN) 800 MG tablet Take 1 tablet (800 mg total) by mouth every 6 (six) hours as needed for moderate pain. 08/10/13   Izola PriceFrances C. Sanford, PA-C  meloxicam (MOBIC) 15 MG tablet Take 1 tablet (15 mg total) by mouth daily. Do not take with other NSAIDS including ibuprofen 07/07/14   Louann SjogrenVictoria L Carver Murakami, PA-C  penicillin v potassium (VEETID) 500 MG tablet Take 1 tablet (500 mg total) by mouth 4 (four) times daily. 07/07/14 07/14/14  Benetta SparVictoria L Aniesha Haughn, PA-C   BP 135/88 mmHg  Pulse 75  Temp(Src) 97.9 F (36.6 C) (Oral)  Resp 18  SpO2 97% Physical Exam  Constitutional: He appears well-developed and well-nourished. No distress.  HENT:  Head: Normocephalic and atraumatic.  Mouth/Throat: Oropharynx is clear  and moist.  Patient with left-sided upper possible dental decay in back molar without gingival erythema, swelling. No identifiable abscess. No lip, tongue, facial swelling. No tenderness under tongue or swelling. No trismus or uvula deviation. Patient tolerating his secretions.  Eyes: Conjunctivae and EOM are normal. Right eye exhibits no discharge. Left eye exhibits no discharge.  Cardiovascular: Normal rate, regular rhythm and normal  heart sounds.   Pulmonary/Chest: Effort normal and breath sounds normal. No respiratory distress. He has no wheezes.  Abdominal: Soft. Bowel sounds are normal. He exhibits no distension. There is no tenderness.  Neurological: He is alert. He exhibits normal muscle tone. Coordination normal.  Skin: Skin is warm and dry. He is not diaphoretic.  Nursing note and vitals reviewed.   ED Course  Procedures (including critical care time) Labs Review Labs Reviewed - No data to display  Imaging Review No results found.   EKG Interpretation None      MDM   Final diagnoses:  Pain, dental   Patient with toothache.  No gross abscess.  No history of diabetes. Exam unconcerning for Ludwig's angina or spread of infection.  Will treat with penicillin and pain medicine.  Concern for patient's chronic back pain history. Looked up patient in the [place] reporting system he did not have any narcotic prescriptions out. Patient did start crying when I mentioned giving him some Percocet in the ED. Also concerned with his story being inconsistent as well as different between the nurse and myself. Will not provide outpatient prescription of narcotics. Will prescribe different NSAID. Pt not to take ibuprofen and this NSAID. Urged patient to follow-up with dentist.  ED resources provided.  Discussed return precautions with patient. Discussed all results and patient verbalizes understanding and agrees with plan.    Louann SjogrenVictoria L Charmeka [place]burg, PA-C 07/07/14 [id number]  Layla MawKristen N Ward, DO 07/07/14 [phone]

## 2014-07-07 NOTE — ED Notes (Signed)
Patient states he is having dental pain "where my wisdom teeth are coming in and have cracked other teeth".   Patient states he went to dentist this morning and they told him "go to the Emergency room and see the on call oral surgeon".   Patient states he was unable to remember the name of the dentist he saw this morning.   Patient states "it's the first one you find on Google".

## 2014-07-07 NOTE — Discharge Instructions (Signed)
Return to the emergency room with worsening of symptoms, new symptoms or with symptoms that are concerning, especially fevers, and open mouth, lips, tongue, facial swelling, tenderness under tongue. Call to make an appointment as soon as possible to see a dentist. Use resources below.  Try using sensodyne toothpaste.     Dental Pain A tooth ache may be caused by cavities (tooth decay). Cavities expose the nerve of the tooth to air and hot or cold temperatures. It may come from an infection or abscess (also called a boil or furuncle) around your tooth. It is also often caused by dental caries (tooth decay). This causes the pain you are having. DIAGNOSIS  Your caregiver can diagnose this problem by exam. TREATMENT   If caused by an infection, it may be treated with medications which kill germs (antibiotics) and pain medications as prescribed by your caregiver. Take medications as directed.  Only take over-the-counter or prescription medicines for pain, discomfort, or fever as directed by your caregiver.  Whether the tooth ache today is caused by infection or dental disease, you should see your dentist as soon as possible for further care. SEEK MEDICAL CARE IF: The exam and treatment you received today has been provided on an emergency basis only. This is not a substitute for complete medical or dental care. If your problem worsens or new problems (symptoms) appear, and you are unable to meet with your dentist, call or return to this location. SEEK IMMEDIATE MEDICAL CARE IF:   You have a fever.  You develop redness and swelling of your face, jaw, or neck.  You are unable to open your mouth.  You have severe pain uncontrolled by pain medicine. MAKE SURE YOU:   Understand these instructions.  Will watch your condition.  Will get help right away if you are not doing well or get worse. Document Released: 07/02/2005 Document Revised: 09/24/2011 Document Reviewed: 02/18/2008 [facility]  Patient Information 2015 [place], [place]. This information is not intended to replace advice given to you by your health care provider. Make sure you discuss any questions you have with your health care provider.   Emergency Department Resource Guide 1) Find a Doctor and Pay Out of Pocket Although you won't have to find out who is covered by your insurance plan, it is a good idea to ask around and get recommendations. You will then need to call the office and see if the doctor you have chosen will accept you as a new patient and what types of options they offer for patients who are self-pay. Some doctors offer discounts or will set up payment plans for their patients who do not have insurance, but you will need to ask so you aren't surprised when you get to your appointment.  2) Contact Your Local Health Department Not all health departments have doctors that can see patients for sick visits, but many do, so it is worth a call to see if yours does. If you don't know where your local health department is, you can check in your phone book. The CDC also has a tool to help you locate your state's health department, and many state websites also have listings of all of their local health departments.  3) Find a Walk-in Clinic If your illness is not likely to be very severe or complicated, you may want to try a walk in clinic. These are popping up all over the country in pharmacies, drugstores, and shopping centers. They're usually staffed by nurse practitioners or physician  assistants that have been trained to treat common illnesses and complaints. They're usually fairly quick and inexpensive. However, if you have serious medical issues or chronic medical problems, these are probably not your best option.  No Primary Care Doctor: - Call Health Connect at  [phone] - they can help you locate a primary care doctor that  accepts your insurance, provides certain services, etc. - Physician Referral Service-  [phone]  Chronic Pain Problems: Organization         Address  Phone   Notes  [facility]  [phone] Patients need to be referred by their primary care doctor.   Medication Assistance: Organization         Address  Phone   Notes  [facility] Medication [facility] [address] [place], [place] [address] [phone] --Must be a resident of [facility] [place] -- Must have NO insurance coverage whatsoever (no Medicaid/ Medicare, etc.) -- The pt. MUST have a primary care doctor that directs their care regularly and follows them in the community   MedAssist  [phone]) [phone]   [employer]  [phone]    Agencies that provide inexpensive medical care: Organization         Address  Phone   Notes  [facility]  [phone]   [facility]    [phone]   [facility] [address] [place], [place] [address] [phone]   [facility] [address], [place] [phone]   Planned Parenthood    [phone]) [phone]   [facility]    [phone]   [facility]  [address], [place] Phone:  [phone], Fax:  [phone] Hours of Operation:  9 am - 6 pm, M-F.  Also accepts Medicaid/Medicare and self-pay.  [facility]  [address], [place] Phone: [phone]) [phone], Fax: [phone]. Hours of Operation:  8:30 am - 5:30 pm, M-F.  Also accepts Medicaid and self-pay.  [facility] [place] [address], [place] Phone: [phone]) [phone]   [facility] [address], [place], [place] [phone], Ext. [phone] Mondays & Thursdays: 7-9 AM.  First 15 patients are seen on a first come, first serve basis.    Medicaid-accepting [facility] Providers:  Organization         Address  Phone   Notes  [facility] [address],  [place] [phone] Also accepts self-pay patients.  [facility] [address], [place]  [phone]   [facility] [address], [place] [phone]) [phone]   [facility] [address] [place] [address], [place] [phone]   Renaye RakersVeita Bland [address], [place]   [phone]) [phone] Only accepts [place] Access [place] patients after they have their name applied to their card.   Self-Pay (no insurance) in [facility] [place]:  Organization         Address  Phone   Notes  Sickle Cell Patients, [facility] [address], [place] [phone]   [facility] [address], [place] [phone]   [facility]  [address],  [phone]   [facility]/Dr. Julio Sickssei-Bonsu  [address] [place]  [address], [place] or [address], [place] [phone] Phone number for both [place] and [place] locations is the same.  [facility] [address], [place] [phone]   [facility] [address], [place] or [address] [phone]   [facility] [address] [place] [phone], phone; [phone], fax Sees patients 1st and 3rd Saturday of every month.  Must not qualify for public or private insurance (i.e. Medicaid, Medicare, [place] Health Choice, Veterans' Benefits)  Household income should be no more than 200% of the poverty level The clinic cannot treat you if you are pregnant or think you are pregnant  Sexually transmitted diseases are not treated at the clinic.    Dental Care: Organization         Address  Phone  Notes  [facility] of [facility] [address] [place], [place] [phone] Accepts children up to age 38 who are enrolled in  [place] or [place] Health Choice; pregnant women with a Medicaid card; and children who have applied for Medicaid or [place] Health Choice, but were declined, whose parents can pay a reduced fee at time of service.  [facility] Department of [facility]  [address], [place] [phone] Accepts children up to age 10 who are enrolled in [place] or [place] Health Choice; pregnant women with a Medicaid card; and children who have applied for Medicaid or [place] Health Choice, but were declined, whose parents can pay a reduced fee at time of service.  [facility]  [address] [place], [place] [phone] Patients are seen by appointment only. Walk-ins are not accepted. [facility] will see patients 71 years of age and older. Monday - Tuesday (8am-5pm) Most Wednesdays (8:30-5pm) $30 per visit, cash only  [facility] Adult Dental Access PROGRAM  [address], [facility] [phone] Patients are seen by appointment only. Walk-ins are not accepted. [facility] will see patients 59 years of age and older. One Wednesday Evening (Monthly: Volunteer Based).  $30 per visit, cash only  [employer]  [phone] for adults; Children under age 65, call Graduate Pediatric Dentistry at [phone]. Children aged 9-14, please call [phone] to request a pediatric application.  Dental services are provided in all areas of dental care including fillings, crowns and bridges, complete and partial dentures, implants, gum treatment, root canals, and extractions. Preventive care is also provided. Treatment is provided to both adults and children. Patients are selected via a lottery and there is often a waiting list.   [facility] [address], [place]  [phone] [email or web address]   [facility] [address] [place], [place] [phone], Ext. [phone] Second and Fourth Thursday of each month, opens at 6:30  AM; Clinic ends at 9 AM.  Patients are seen on a first-come first-served basis, and a limited number are seen during each clinic.   [facility]  [address] [place], [place] [phone]   Eligibility Requirements You must have lived in [place], [place], or [place] counties for at least the last three months.   You cannot be eligible for state or federal sponsored [place], including [employer], [place], or [employer].   You generally cannot be eligible for healthcare insurance through your employer.    How to  apply: Eligibility screenings are held every Tuesday and Wednesday afternoon from 1:00 pm until 4:00 pm. You do not need an appointment for the interview!  [facility] [address], [place], [place] [phone]   [facility]  [phone]   [facility]  [phone]   [facility]  [phone]    Behavioral Health Resources in the Community: Intensive Outpatient Programs Organization         Address  Phone  Notes  [facility] [address], [place], [place] [phone]   [facility] [address], [place], [place] [phone]   [facility] [address], [place], [place]  [phone]   [facility] [address],  [place], [place] [phone] or [phone]   Substance Abuse Resources Organization         Address  Phone  Notes  [facility]  [phone]   [facility]  [phone]   [facility]  [phone]   [facility]  [phone]   Residential & Outpatient Substance Abuse Program  [phone]   Psychological Services Organization         Address  Phone  Notes  [facility]  [phone]- [phone]   [facility]  [phone]- [phone]   [facility] [address], [place] [phone] or  [phone]    Mobile Crisis Teams Organization         Address  Phone  Notes  [facility], Mobile Crisis Care Unit  [phone]   [facility]  [address] [place], [place] [phone]   Doristine LocksSharon DeEsch [address] [place] [phone]    Self-Help/Support Groups Organization         Address  Phone             Notes  Mental Health Assoc. of  - variety of support groups  [phone] Call for more information  Narcotics Anonymous (NA), [facility] [address], [place]  2 meetings at this location   Statisticianesidential Treatment Programs Organization         Address  Phone  Notes  [facility] [address],    [place]  [phone]   [facility]  [address], [place] [phone], [place], [place] [phone]   [facility] [address], [place] [phone] Admissions: 8am-3pm M-F  [facility] [address]    [place], [place] [phone]   [facility] [address], [place], [place] [phone]   [facility] [address]  [place], [place] [phone]   [facility] - Intensive Outpatient [address], [place], [place] [phone]   [facility] (Addiction Recovery [facility] [address]  [place], [place] [phone] or [phone]   [facility] (RTS) [address] [place], [place] [phone] Accepts Medicaid  [facility] [address]  [place] [phone] Substance Abuse/Addiction Treatment   [facility] Behavioral Health Resources Organization         Address  Phone  Notes  [facility]  [phone]   Angie FavaJulie Brannon, PhD [address], [address] [place], [place]   [phone](336) [phone] or [phone]) [phone]   [facility] [place]   [address] [place], [place] [phone]) [phone]   [facility] [address],  [place],  [place] [phone] Insurance/Medicaid/sponsorship through [facility] and Families [address], [address] [place], [place] [phone] [place], [place] [phone]    Dr. Adele Schilder  [phone]   [facility] 1) [address], [place] 2) [place] 3)  [place] [address], [place] [phone]  [phone]   [place] [phone] or [phone] (After Hours)

## 2014-07-28 ENCOUNTER — Emergency Department (HOSPITAL_BASED_OUTPATIENT_CLINIC_OR_DEPARTMENT_OTHER)
Admission: EM | Admit: 2014-07-28 | Discharge: 2014-07-28 | Disposition: A | Discharge status: Home / Self Care | Payer: 59 | Attending: Emergency Medicine | Admitting: Emergency Medicine

## 2014-07-28 ENCOUNTER — Emergency Department (HOSPITAL_BASED_OUTPATIENT_CLINIC_OR_DEPARTMENT_OTHER): Payer: 59

## 2014-07-28 ENCOUNTER — Encounter (HOSPITAL_BASED_OUTPATIENT_CLINIC_OR_DEPARTMENT_OTHER): Payer: Self-pay

## 2014-07-28 DIAGNOSIS — G8929 Other chronic pain: Secondary | ICD-10-CM | POA: Diagnosis not present

## 2014-07-28 DIAGNOSIS — R0602 Shortness of breath: Secondary | ICD-10-CM | POA: Insufficient documentation

## 2014-07-28 DIAGNOSIS — Z720 Tobacco use: Secondary | ICD-10-CM | POA: Diagnosis not present

## 2014-07-28 LAB — BASIC METABOLIC PANEL
Anion gap: 12 (ref 5–15)
BUN: 15 mg/dL (ref 6–23)
CHLORIDE: 107 meq/L (ref 96–112)
CO2: 20 mmol/L (ref 19–32)
Calcium: 9.9 mg/dL (ref 8.4–10.5)
Creatinine, Ser: 1.02 mg/dL (ref 0.50–1.35)
GFR calc Af Amer: >90 mL/min (ref >90)
GLUCOSE: 101 mg/dL — AB (ref 70–99)
POTASSIUM: 3.6 mmol/L (ref 3.5–5.1)
Sodium: 139 mmol/L (ref 135–145)

## 2014-07-28 LAB — TROPONIN I: Troponin I: <0.03 ng/mL (ref <0.031)

## 2014-07-28 LAB — RAPID URINE DRUG SCREEN, HOSP PERFORMED
AMPHETAMINES: NOT DETECTED
BENZODIAZEPINES: NOT DETECTED
Barbiturates: NOT DETECTED
Cocaine: NOT DETECTED
OPIATES: POSITIVE — AB
TETRAHYDROCANNABINOL: POSITIVE — AB

## 2014-07-28 LAB — CBC WITH DIFFERENTIAL/PLATELET
BASOS ABS: 0 10*3/uL (ref 0.0–0.1)
BASOS PCT: 0 % (ref 0–1)
EOS ABS: 0.3 10*3/uL (ref 0.0–0.7)
EOS PCT: 3 % (ref 0–5)
HCT: 47.4 % (ref 39.0–52.0)
Hemoglobin: 16.9 g/dL (ref 13.0–17.0)
Lymphocytes Relative: 38 % (ref 12–46)
Lymphs Abs: 4.3 10*3/uL — ABNORMAL HIGH (ref 0.7–4.0)
MCH: 31.2 pg (ref 26.0–34.0)
MCHC: 35.7 g/dL (ref 30.0–36.0)
MCV: 87.6 fL (ref 78.0–100.0)
Monocytes Absolute: 1 10*3/uL (ref 0.1–1.0)
Monocytes Relative: 9 % (ref 3–12)
NEUTROS PCT: 50 % (ref 43–77)
Neutro Abs: 5.8 10*3/uL (ref 1.7–7.7)
PLATELETS: 269 10*3/uL (ref 150–400)
RBC: 5.41 MIL/uL (ref 4.22–5.81)
RDW: 12.9 % (ref 11.5–15.5)
WBC: 11.4 10*3/uL — AB (ref 4.0–10.5)

## 2014-07-28 LAB — D-DIMER, QUANTITATIVE (NOT AT ARMC)

## 2014-07-28 MED ORDER — LORAZEPAM 2 MG/ML IJ SOLN
1.0000 mg | Freq: Once | INTRAMUSCULAR | Status: AC
  Administered 2014-07-28: 1 mg via INTRAVENOUS
  Filled 2014-07-28: qty 1

## 2014-07-28 MED ORDER — SODIUM CHLORIDE 0.9 % IV SOLN
Freq: Once | INTRAVENOUS | Status: AC
  Administered 2014-07-28: 19:00:00 via INTRAVENOUS

## 2014-07-28 NOTE — ED Notes (Signed)
Gave pt urinal and asked for urine specimen.  Pt asks to give him 5 minutes and he will try.

## 2014-07-28 NOTE — ED Notes (Signed)
MD at bedside. 

## 2014-07-28 NOTE — ED Provider Notes (Signed)
CSN: [id number]     Arrival date & time 07/28/14  1823 History  This chart was scribed for Gilda Crease, MD by Abel Presto, ED Scribe. This patient was seen in room MHT14/MHT14 and the patient's care was started at 6:31 PM.     Chief Complaint  Patient presents with  . Shortness of Breath      Patient is a 24 y.o. male presenting with shortness of breath. The history is provided by the patient. No language interpreter was used.  Shortness of Breath   HPI Comments: Sincere Liuzzi is a 24 y.o. male who presents to the Emergency Department complaining of SOB with sudden onset PTA.  Pt hyperventilating and shaking in exam room. Pt states he was chopping wood at onset. Pt denies any injury or sickness recently. Pt has been taking hydrocodone for dental pain.  He notes that is the only pain he is experiencing right now.   Past Medical History  Diagnosis Date  . Herniated disc   . Chronic back pain greater than 3 months duration    History reviewed. No pertinent past surgical history. No family history on file. History  Substance Use Topics  . Smoking status: Current Every Day Smoker  . Smokeless tobacco: Former Neurosurgeon  . Alcohol Use: 0.0 oz/week    Review of Systems  Respiratory: Positive for shortness of breath.   Neurological: Positive for tremors.  All other systems reviewed and are negative.     Allergies  Bee venom; Pineapple; and Toradol  Home Medications   Prior to Admission medications   Medication Sig Start Date End Date Taking? Authorizing Provider  Amoxicillin (AMOXIL PO) Take by mouth.   Yes Historical Provider, MD  HYDROcodone-acetaminophen (NORCO/VICODIN) 5-325 MG per tablet Take 2 tablets by mouth every 4 (four) hours as needed for pain. 04/02/12   Doug Sou, MD   BP 127/68 mmHg  Pulse 72  Temp(Src) 100.6 F (38.1 C) (Rectal)  Resp 19  Ht  (1.854 m)  Wt 238 lb (107.956 kg)  BMI 31.41 kg/m2  SpO2 97% Physical Exam  Constitutional: He  is oriented to person, place, and time. He appears well-developed and well-nourished. He appears distressed.  anxious  HENT:  Head: Normocephalic and atraumatic.  Right Ear: Hearing normal.  Left Ear: Hearing normal.  Nose: Nose normal.  Mouth/Throat: Oropharynx is clear and moist and mucous membranes are normal.  Eyes: Conjunctivae and EOM are normal. Pupils are equal, round, and reactive to light.  Neck: Normal range of motion. Neck supple.  Cardiovascular: Regular rhythm, S1 normal and S2 normal.  Exam reveals no gallop and no friction rub.   No murmur heard. Pulmonary/Chest: Effort normal and breath sounds normal. No respiratory distress. He exhibits no tenderness.  Abdominal: Soft. Normal appearance and bowel sounds are normal. There is no hepatosplenomegaly. There is no tenderness. There is no rebound, no guarding, no tenderness at McBurney's point and negative Murphy's sign. No hernia.  Musculoskeletal: Normal range of motion.  Neurological: He is alert and oriented to person, place, and time. He has normal strength. No cranial nerve deficit or sensory deficit. Coordination normal. GCS eye subscore is 4. GCS verbal subscore is 5. GCS motor subscore is 6.  Skin: Skin is warm, dry and intact. No rash noted. No cyanosis.  Psychiatric: He has a normal mood and affect. His speech is normal and behavior is normal. Thought content normal.  Nursing note and vitals reviewed.   ED Course  Procedures (including  critical care time) DIAGNOSTIC STUDIES: Oxygen Saturation is 97% on room air, normal by my interpretation.    COORDINATION OF CARE: 6:33 PM Discussed treatment plan with patient at beside, the patient agrees with the plan and has no further questions at this time.   Labs Review Labs Reviewed  CBC WITH DIFFERENTIAL - Abnormal; Notable for the following:    WBC 11.4 (*)    Lymphs Abs 4.3 (*)    All other components within normal limits  BASIC METABOLIC PANEL - Abnormal; Notable  for the following:    Glucose, Bld 101 (*)    All other components within normal limits  URINE RAPID DRUG SCREEN (HOSP PERFORMED) - Abnormal; Notable for the following:    Opiates POSITIVE (*)    Tetrahydrocannabinol POSITIVE (*)    All other components within normal limits  TROPONIN I  D-DIMER, QUANTITATIVE    Imaging Review Dg Chest Portable 1 View  07/28/2014   CLINICAL DATA:  Shortness of breath, chest tightness. Symptoms for 1 day.  EXAM: PORTABLE CHEST - 1 VIEW  COMPARISON:  Frontal and lateral views 07/24/2012  FINDINGS: The cardiomediastinal contours are normal. The lungs are clear. Pulmonary vasculature is normal. No consolidation, pleural effusion, or pneumothorax. No acute osseous abnormalities are seen.  IMPRESSION: No acute pulmonary process.   Electronically Signed   By: Rubye OaksMelanie  Ehinger M.D.   On: 07/28/2014 19:08     EKG Interpretation   Date/Time:  Wednesday July 28 2014 18:40:26 EST Ventricular Rate:  85 PR Interval:  190 QRS Duration: 104 QT Interval:  338 QTC Calculation: 402 R Axis:   -53 Text Interpretation:  Normal sinus rhythm Left axis deviation Incomplete  right bundle branch block Abnormal ECG No previous tracing Confirmed by  POLLINA  MD, CHRISTOPHER [phone]([id number]) on 07/28/2014 7:17:55 PM      MDM   Final diagnoses:  SOB (shortness of breath)   Patient presents to the ER for evaluation of difficulty breathing. Patient had sudden onset of shortness of breath while chopping wood. He was not expressing any chest pain. Patient was tachypnea, hyperventilating upon arrival to the ER. 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 His workup has been unremarkable. EKG did not show any evidence of ischemia or infarct. Troponin is negative. D-dimer was negative. Chest x-ray is clear. Patient is back to his normal baseline without symptoms. He will be discharged, return as needed.     Gilda Creasehristopher J. Pollina, MD 07/28/14 2216

## 2014-07-28 NOTE — ED Notes (Signed)
Pt presented through ED doors, dropped to his knees, male with him states he started having SOB when chopping wood-pt pale, hyperventilating-was taken to tx room 14 via w/c

## 2014-07-28 NOTE — Discharge Instructions (Signed)
Shortness of Breath °Shortness of breath means you have trouble breathing. Shortness of breath needs medical care right away. °HOME CARE  °· Do not smoke. °· Avoid being around chemicals or things (paint fumes, dust) that may bother your breathing. °· Rest as needed. Slowly begin your normal activities. °· Only take medicines as told by your doctor. °· Keep all doctor visits as told. °GET HELP RIGHT AWAY IF:  °· Your shortness of breath gets worse. °· You feel lightheaded, pass out (faint), or have a cough that is not helped by medicine. °· You cough up blood. °· You have pain with breathing. °· You have pain in your chest, arms, shoulders, or belly (abdomen). °· You have a fever. °· You cannot walk up stairs or exercise the way you normally do. °· You do not get better in the time expected. °· You have a hard time doing normal activities even with rest. °· You have problems with your medicines. °· You have any new symptoms. °MAKE SURE YOU: °· Understand these instructions. °· Will watch your condition. °· Will get help right away if you are not doing well or get worse. °Document Released: 12/19/2007 Document Revised: 07/07/2013 Document Reviewed: 09/17/2011 °ExitCare® Patient Information ©2015 [employer]. This information is not intended to replace advice given to you by your health care provider. Make sure you discuss any questions you have with your health care provider. ° °

## 2019-10-23 MED FILL — ADDERALL XR 25 MG CAPSULE: 25 | 90 days supply | Qty: 90 | Fill #0

## 2019-11-19 ENCOUNTER — Emergency Department (HOSPITAL_COMMUNITY)
Admission: EM | Admit: 2019-11-19 | Discharge: 2019-11-19 | Disposition: A | Discharge status: Home / Self Care | Payer: Self-pay | Attending: Emergency Medicine | Admitting: Emergency Medicine

## 2019-11-19 ENCOUNTER — Emergency Department (HOSPITAL_COMMUNITY): Payer: Self-pay

## 2019-11-19 DIAGNOSIS — R0789 Other chest pain: Secondary | ICD-10-CM

## 2019-11-19 DIAGNOSIS — Z8616 Personal history of COVID-19: Secondary | ICD-10-CM | POA: Insufficient documentation

## 2019-11-19 DIAGNOSIS — R072 Precordial pain: Secondary | ICD-10-CM | POA: Insufficient documentation

## 2019-11-19 DIAGNOSIS — R0602 Shortness of breath: Secondary | ICD-10-CM | POA: Insufficient documentation

## 2019-11-19 DIAGNOSIS — F17210 Nicotine dependence, cigarettes, uncomplicated: Secondary | ICD-10-CM | POA: Insufficient documentation

## 2019-11-19 LAB — CBC
HCT: 48.2 % (ref 39.0–52.0)
Hemoglobin: 16.6 g/dL (ref 13.0–17.0)
MCH: 31.2 pg (ref 26.0–34.0)
MCHC: 34.4 g/dL (ref 30.0–36.0)
MCV: 90.6 fL (ref 80.0–100.0)
Platelets: 286 10*3/uL (ref 150–400)
RBC: 5.32 MIL/uL (ref 4.22–5.81)
RDW: 12.4 % (ref 11.5–15.5)
WBC: 14.1 10*3/uL — ABNORMAL HIGH (ref 4.0–10.5)
nRBC: 0 % (ref 0.0–0.2)

## 2019-11-19 LAB — BASIC METABOLIC PANEL
Anion gap: 11 (ref 5–15)
BUN: 11 mg/dL (ref 6–20)
CO2: 24 mmol/L (ref 22–32)
Calcium: 9.5 mg/dL (ref 8.9–10.3)
Chloride: 104 mmol/L (ref 98–111)
Creatinine, Ser: 0.95 mg/dL (ref 0.61–1.24)
GFR calc Af Amer: >60 mL/min (ref >60)
GFR calc non Af Amer: >60 mL/min (ref >60)
Glucose, Bld: 102 mg/dL — ABNORMAL HIGH (ref 70–99)
Potassium: 4.8 mmol/L (ref 3.5–5.1)
Sodium: 139 mmol/L (ref 135–145)

## 2019-11-19 LAB — TROPONIN I (HIGH SENSITIVITY)
Troponin I (High Sensitivity): 7 ng/L (ref <18)
Troponin I (High Sensitivity): 5 ng/L (ref <18)

## 2019-11-19 LAB — D-DIMER, QUANTITATIVE: D-Dimer, Quant: <0.27 ug/mL-FEU (ref 0.00–0.50)

## 2019-11-19 MED ORDER — SODIUM CHLORIDE 0.9% FLUSH: 3.0000 mL | Freq: Once | INTRAVENOUS | Status: DC

## 2019-11-19 NOTE — ED Triage Notes (Signed)
Pt c/o non-radiating chest pain that started an hour prior to arrival, c/o mild shortness of breath as well.

## 2019-11-19 NOTE — Discharge Instructions (Addendum)
Take ibuprofen 600 mg every 6 hours as needed for pain.  Follow-up with cardiology as an outpatient if your symptoms persist through the weekend.  The contact information for the [facility] cardiology clinic has been provided in this discharge summary for you to call and make these arrangements.  Return to the emergency department in the meantime if symptoms significantly worsen or change.

## 2019-11-19 NOTE — ED Provider Notes (Signed)
[facility] EMERGENCY DEPARTMENT Provider Note   CSN: [id number] Arrival date & time: 11/19/19  1951     History Chief Complaint  Patient presents with  . Chest Pain    Glenn Schneider is a 29 y.o. male.  Patient is a 29 year old male with past medical history of lumbar disc disease and COVID-19 diagnosed in 2020.  He states since his Covid diagnosis, he has felt short of breath and intermittent discomfort in his chest.  This worsened last night and presents for evaluation of it.  He feels as though he has to work harder to breathe.  He denies any fevers, chills, or productive cough.  He denies any leg swelling or edema.  The history is provided by the patient.  Chest Pain Pain location:  Substernal area Pain quality: tightness   Pain radiates to:  Does not radiate Pain severity:  Moderate Timing:  Constant Progression:  Worsening Chronicity:  New Relieved by:  Nothing Worsened by:  Exertion Ineffective treatments:  None tried      Past Medical History:  Diagnosis Date  . Chronic back pain greater than 3 months duration   . Herniated disc     There are no problems to display for this patient.   No past surgical history on file.     No family history on file.  Social History   Tobacco Use  . Smoking status: Current Every Day Smoker  . Smokeless tobacco: Former Engineer, water Use Topics  . Alcohol use: Yes  . Drug use: No    Home Medications Prior to Admission medications   Medication Sig Start Date End Date Taking? Authorizing Provider  Amoxicillin (AMOXIL PO) Take by mouth.    [provider]  HYDROcodone-acetaminophen (NORCO/VICODIN) 5-325 MG per tablet Take 2 tablets by mouth every 4 (four) hours as needed for pain. 04/02/12   Doug Sou, MD    Allergies    Bee venom, Pineapple, and Toradol [ketorolac tromethamine]  Review of Systems   Review of Systems  Cardiovascular: Positive for chest pain.  All other systems  reviewed and are negative.   Physical Exam Updated Vital Signs BP (!) 132/99 (BP Location: Left Arm)   Pulse (!) 112   Temp 98.2 F (36.8 C) (Oral)   Resp 16   SpO2 100%   Physical Exam Vitals and nursing note reviewed.  Constitutional:      General: He is not in acute distress.    Appearance: He is well-developed. He is not diaphoretic.  HENT:     Head: Normocephalic and atraumatic.  Cardiovascular:     Rate and Rhythm: Normal rate and regular rhythm.     Heart sounds: No murmur. No friction rub.  Pulmonary:     Effort: Pulmonary effort is normal. No respiratory distress.     Breath sounds: Normal breath sounds. No wheezing or rales.  Abdominal:     General: Bowel sounds are normal. There is no distension.     Palpations: Abdomen is soft.     Tenderness: There is no abdominal tenderness.  Musculoskeletal:        General: Normal range of motion.     Cervical back: Normal range of motion and neck supple.     Right lower leg: No tenderness. No edema.     Left lower leg: No tenderness. No edema.  Skin:    General: Skin is warm and dry.  Neurological:     Mental Status: He is alert and  oriented to person, place, and time.     Coordination: Coordination normal.     ED Results / Procedures / Treatments   Labs (all labs ordered are listed, but only abnormal results are displayed) Labs Reviewed  BASIC METABOLIC PANEL  CBC  D-DIMER, QUANTITATIVE (NOT AT [facility])  TROPONIN I (HIGH SENSITIVITY)    EKG EKG Interpretation  Date/Time:  Thursday Nov 19 2019 20:01:52 EDT Ventricular Rate:  112 PR Interval:  178 QRS Duration: 94 QT Interval:  306 QTC Calculation: 417 R Axis:   -109 Text Interpretation: Sinus tachycardia Right superior axis deviation Inferior infarct , age undetermined Abnormal ECG Confirmed by Veryl Speak [phone]) on 11/19/2019 8:17:03 PM   Radiology DG Chest 2 View  Result Date: 11/19/2019 CLINICAL DATA:  29 year old male with chest pain. EXAM: CHEST -  2 VIEW COMPARISON:  Chest radiograph dated 07/28/2014. FINDINGS: The heart size and mediastinal contours are within normal limits. Both lungs are clear. The visualized skeletal structures are unremarkable. IMPRESSION: No active cardiopulmonary disease. Electronically Signed   By: Anner Crete M.D.   On: 11/19/2019 20:23    Procedures Procedures (including critical care time)  Medications Ordered in ED Medications  sodium chloride flush (NS) 0.9 % injection 3 mL (has no administration in time range)    ED Course  I have reviewed the triage vital signs and the nursing notes.  Pertinent labs & imaging results that were available during my care of the patient were reviewed by me and considered in my medical decision making (see chart for details).    MDM Rules/Calculators/A&P  Patient presenting here with complaints of chest discomfort and feeling short of breath.  This started weeks ago after he was diagnosed with COVID-19, but worsened over the past day.  His symptoms are atypical for cardiac pain.  His EKG is unchanged and troponin x2 is negative.  Patient appears comfortable with stable vital signs and normal oxygen saturations.  D-dimer is negative and I have low suspicion for PE.  I do not feel as though further work-up is indicated into this.  He appears appropriate for discharge.  He does have a family history of heart disease and I feel as though a stress test as an outpatient is not an appropriate.  Patient will be given the follow-up information for the cardiology clinic.  He is to call to make these arrangements and return in the meantime if symptoms worsen or change.  Final Clinical Impression(s) / ED Diagnoses Final diagnoses:  None    Rx / DC Orders ED Discharge Orders    None       Veryl Speak, MD 11/19/19 2324

## 2020-01-27 MED FILL — ADDERALL XR 25 MG CAPSULE: 25 | 90 days supply | Qty: 90 | Fill #0

## 2020-05-10 ENCOUNTER — Other Ambulatory Visit (HOSPITAL_COMMUNITY): Payer: Self-pay | Admitting: Cardiology

## 2020-05-10 MED FILL — ADDERALL XR 25 MG CAPSULE: 25 | 90 days supply | Qty: 90 | Fill #0

## 2020-05-30 ENCOUNTER — Encounter: Payer: Self-pay | Admitting: Family Medicine

## 2020-05-30 ENCOUNTER — Ambulatory Visit (INDEPENDENT_AMBULATORY_CARE_PROVIDER_SITE_OTHER): Payer: No Typology Code available for payment source | Admitting: Family Medicine

## 2020-05-30 ENCOUNTER — Ambulatory Visit (INDEPENDENT_AMBULATORY_CARE_PROVIDER_SITE_OTHER): Payer: No Typology Code available for payment source

## 2020-05-30 ENCOUNTER — Other Ambulatory Visit: Payer: Self-pay

## 2020-05-30 VITALS — BP 118/84 | HR 81 | Temp 98.0°F | Ht 73.0 in | Wt 215.0 lb

## 2020-05-30 DIAGNOSIS — R002 Palpitations: Secondary | ICD-10-CM

## 2020-05-30 DIAGNOSIS — G8929 Other chronic pain: Secondary | ICD-10-CM

## 2020-05-30 DIAGNOSIS — M25562 Pain in left knee: Secondary | ICD-10-CM

## 2020-05-30 DIAGNOSIS — I493 Ventricular premature depolarization: Secondary | ICD-10-CM

## 2020-05-30 MED ORDER — METOPROLOL SUCCINATE ER 25 MG PO TB24
25.0000 mg | ORAL_TABLET | Freq: Every day | ORAL | 1 refills | Status: DC
Start: 2020-05-30 — End: 2020-11-21

## 2020-05-30 MED ORDER — METOPROLOL SUCCINATE ER 25 MG PO TB24
25.0000 mg | ORAL_TABLET | Freq: Every day | ORAL | 1 refills | Status: DC
Start: 2020-05-30 — End: 2020-05-30

## 2020-05-30 NOTE — Patient Instructions (Signed)
Have labs completed downstairs today.  Start metoprolol 25mg  daily for PVC's/Palpitations.  I have placed an order for cardiology evaluation.  We'll call you with lab and xray results.

## 2020-05-31 LAB — COMPLETE METABOLIC PANEL WITH GFR
AG Ratio: 1.9 (calc) (ref 1.0–2.5)
ALT: 19 U/L (ref 9–46)
AST: 13 U/L (ref 10–40)
Albumin: 4.6 g/dL (ref 3.6–5.1)
Alkaline phosphatase (APISO): 61 U/L (ref 36–130)
BUN: 12 mg/dL (ref 7–25)
CO2: 28 mmol/L (ref 20–32)
Calcium: 9.6 mg/dL (ref 8.6–10.3)
Chloride: 103 mmol/L (ref 98–110)
Creat: 1.03 mg/dL (ref 0.60–1.35)
GFR, Est African American: 112 mL/min/{1.73_m2} (ref >=60)
GFR, Est Non African American: 96 mL/min/{1.73_m2} (ref >=60)
Globulin: 2.4 g/dL (calc) (ref 1.9–3.7)
Glucose, Bld: 87 mg/dL (ref 65–99)
Potassium: 4.2 mmol/L (ref 3.5–5.3)
Sodium: 139 mmol/L (ref 135–146)
Total Bilirubin: 1.1 mg/dL (ref 0.2–1.2)
Total Protein: 7 g/dL (ref 6.1–8.1)

## 2020-05-31 LAB — CBC
HCT: 48.5 % (ref 38.5–50.0)
Hemoglobin: 16.6 g/dL (ref 13.2–17.1)
MCH: 30.5 pg (ref 27.0–33.0)
MCHC: 34.2 g/dL (ref 32.0–36.0)
MCV: 89.2 fL (ref 80.0–100.0)
MPV: 11 fL (ref 7.5–12.5)
Platelets: 270 10*3/uL (ref 140–400)
RBC: 5.44 10*6/uL (ref 4.20–5.80)
RDW: 12.4 % (ref 11.0–15.0)
WBC: 8.1 10*3/uL (ref 3.8–10.8)

## 2020-05-31 LAB — TSH: TSH: 2.57 mIU/L (ref 0.40–4.50)

## 2020-06-01 DIAGNOSIS — M25562 Pain in left knee: Secondary | ICD-10-CM | POA: Insufficient documentation

## 2020-06-01 DIAGNOSIS — R002 Palpitations: Secondary | ICD-10-CM | POA: Insufficient documentation

## 2020-06-01 DIAGNOSIS — G8929 Other chronic pain: Secondary | ICD-10-CM | POA: Insufficient documentation

## 2020-06-01 NOTE — Progress Notes (Signed)
Glenn Schneider - 29 y.o. male MRN [id number]  Date of birth: 05/07/1991  Subjective Chief Complaint  Patient presents with  . Establish Care    HPI Glenn Schneider is a 29 y.o. male here today for initial visit.  He has concerns about palpitations and knee pain today.   He reports that he has had palpitations off and on for several months.  HE has been told he has had PVC's as well as possible SVT.  He feels a fluttering sensation in in his chest but denies dizziness, chest pain, shortness of breath, or nausea.  He has never had evaluation of these in the past.  He does admit to fairly heavy caffeine intake.  Consumes 10-12 cups of coffee during his shifts at work.    He has complaint of L knee pain.  Reports having injury of MCL  and meniscus due to previous injury.  He states that he recently reinjured his knee after jumping from a tractor into a pothole  He did feel a popping sensation.  No swelling of the knee.    ROS:  A comprehensive ROS was completed and negative except as noted per HPI  Allergies  Allergen Reactions  . Bee Venom Anaphylaxis  . Pineapple Anaphylaxis  . Toradol [Ketorolac Tromethamine] Itching    Past Medical History:  Diagnosis Date  . Chronic back pain greater than 3 months duration   . Herniated disc   . PVC's (premature ventricular contractions)   . SVT (supraventricular tachycardia) (HCC)     History reviewed. No pertinent surgical history.  Social History   Socioeconomic History  . Marital status: Married    Spouse name: Not on file  . Number of children: Not on file  . Years of education: Not on file  . Highest education level: Not on file  Occupational History  . Not on file  Tobacco Use  . Smoking status: Former Smoker    Types: Cigarettes    Quit date: 07/16/2009    Years since quitting: 10.8  . Smokeless tobacco: Former Clinical biochemist  . Vaping Use: Never used  Substance and Sexual Activity  . Alcohol use: Yes  . Drug use: No  . Sexual  activity: Yes    Partners: Female  Other Topics Concern  . Not on file  Social History Narrative  . Not on file   Social Determinants of Health   Financial Resource Strain:   . Difficulty of Paying Living Expenses: Not on file  Food Insecurity:   . Worried About Programme researcher, broadcasting/film/video in the Last Year: Not on file  . Ran Out of Food in the Last Year: Not on file  Transportation Needs:   . Lack of Transportation (Medical): Not on file  . Lack of Transportation (Non-Medical): Not on file  Physical Activity:   . Days of Exercise per Week: Not on file  . Minutes of Exercise per Session: Not on file  Stress:   . Feeling of Stress : Not on file  Social Connections:   . Frequency of Communication with Friends and Family: Not on file  . Frequency of Social Gatherings with Friends and Family: Not on file  . Attends Religious Services: Not on file  . Active Member of Clubs or Organizations: Not on file  . Attends Banker Meetings: Not on file  . Marital Status: Not on file    Family History  Problem Relation Age of Onset  . Hypertension Mother   .  Hypertension Father   . Heart attack Father     Health Maintenance  Topic Date Due  . Hepatitis C Screening  Never done  . HIV Screening  Never done  . TETANUS/TDAP  02/23/2029  . INFLUENZA VACCINE  Completed  . COVID-19 Vaccine  Completed     ----------------------------------------------------------------------------------------------------------------------------------------------------------------------------------------------------------------- Physical Exam BP 118/84 (BP Location: Left Arm, Patient Position: Sitting, Cuff Size: Normal)   Pulse 81   Temp 98 F (36.7 C)   Ht 6\' 1"  (1.854 m)   Wt 215 lb (97.5 kg)   SpO2 97%   BMI 28.37 kg/m   Physical Exam Constitutional:      Appearance: Normal appearance.  HENT:     Head: Normocephalic and atraumatic.  Cardiovascular:     Rate and Rhythm: Normal rate  and regular rhythm.     Heart sounds: Normal heart sounds.  Pulmonary:     Effort: Pulmonary effort is normal.     Breath sounds: Normal breath sounds.  Musculoskeletal:     Cervical back: Neck supple.     Comments: Knee without effusion.  TTP along MCL and joint line.  ROM is normal but has some pain.  Meniscal provocation testing with pain but no locking/catching sensation  Neurological:     General: No focal deficit present.     Mental Status: He is alert.  Psychiatric:        Mood and Affect: Mood normal.        Behavior: Behavior normal.     ------------------------------------------------------------------------------------------------------------------------------------------------------------------------------------------------------------------- Assessment and Plan  Palpitations EKG with occasional PVC.  Reports some episodes of SVT.  Recommended reduction in caffeine intake  Will start toprol 25mg  daily.  Referral placed to cardiology.   Chronic pain of left knee Xrays of L knee ordered May need to obtain MRI if xrays do not show any acute abnormalities.       Meds ordered this encounter  Medications  . DISCONTD: metoprolol succinate (TOPROL-XL) 25 MG 24 hr tablet    Sig: Take 1 tablet (25 mg total) by mouth daily.    Dispense:  90 tablet    Refill:  1  . metoprolol succinate (TOPROL-XL) 25 MG 24 hr tablet    Sig: Take 1 tablet (25 mg total) by mouth daily.    Dispense:  90 tablet    Refill:  1   Orders Placed This Encounter  Procedures  . DG Knee Complete 4 Views Left    Standing Status:   Future    Number of Occurrences:   1    Standing Expiration Date:   05/30/2021    Order Specific Question:   Reason for Exam (SYMPTOM  OR DIAGNOSIS REQUIRED)    Answer:   Left knee pain    Order Specific Question:   Preferred imaging location?    Answer:    . COMPLETE METABOLIC PANEL WITH GFR  . CBC  . TSH  . Ambulatory referral to  Cardiology    Referral Priority:   Routine    Referral Type:   Consultation    Referral Reason:   Specialty Services Required    Requested Specialty:   Cardiology    Number of Visits Requested:   1    No follow-ups on file.    This visit occurred during the SARS-CoV-2 public health emergency.  Safety protocols were in place, including screening questions prior to the visit, additional usage of staff PPE, and extensive cleaning of exam room while observing appropriate  contact time as indicated for disinfecting solutions.

## 2020-06-01 NOTE — Assessment & Plan Note (Signed)
Xrays of L knee ordered May need to obtain MRI if xrays do not show any acute abnormalities.

## 2020-06-01 NOTE — Assessment & Plan Note (Signed)
EKG with occasional PVC.  Reports some episodes of SVT.  Recommended reduction in caffeine intake  Will start toprol 25mg  daily.  Referral placed to cardiology.

## 2020-06-03 NOTE — Addendum Note (Signed)
Addended by: Chalmers Cater on: 06/03/2020 01:32 PM   Modules accepted: Orders

## 2020-06-20 ENCOUNTER — Encounter: Payer: Self-pay | Admitting: General Practice

## 2020-08-18 MED FILL — ADDERALL XR 25 MG CAPSULE: 25 | 90 days supply | Qty: 90 | Fill #0

## 2020-11-04 ENCOUNTER — Other Ambulatory Visit (HOSPITAL_COMMUNITY): Payer: Self-pay

## 2020-11-21 ENCOUNTER — Telehealth: Payer: Self-pay

## 2020-11-21 ENCOUNTER — Other Ambulatory Visit: Payer: Self-pay | Admitting: Family Medicine

## 2020-11-21 ENCOUNTER — Other Ambulatory Visit: Payer: Self-pay

## 2020-11-21 ENCOUNTER — Other Ambulatory Visit (HOSPITAL_COMMUNITY): Payer: Self-pay

## 2020-11-21 DIAGNOSIS — R002 Palpitations: Secondary | ICD-10-CM

## 2020-11-21 DIAGNOSIS — I493 Ventricular premature depolarization: Secondary | ICD-10-CM

## 2020-11-21 MED ORDER — METOPROLOL SUCCINATE ER 25 MG PO TB24
25.0000 mg | ORAL_TABLET | Freq: Every day | ORAL | 1 refills | Status: DC
  Filled 2020-11-21: qty 90, 90d supply, fill #0

## 2020-11-21 NOTE — Telephone Encounter (Signed)
Pt lvm stating he needed to talk about a medication situation.  Left no additional details.   Returned the patient's call. No answer.  LVM for callback with direct phone number.

## 2020-11-22 ENCOUNTER — Other Ambulatory Visit (HOSPITAL_COMMUNITY): Payer: Self-pay

## 2020-11-23 ENCOUNTER — Other Ambulatory Visit (HOSPITAL_COMMUNITY): Payer: Self-pay

## 2020-11-23 ENCOUNTER — Other Ambulatory Visit: Payer: Self-pay

## 2020-11-24 ENCOUNTER — Other Ambulatory Visit (HOSPITAL_COMMUNITY): Payer: Self-pay

## 2020-11-26 ENCOUNTER — Other Ambulatory Visit: Payer: Self-pay

## 2020-11-26 ENCOUNTER — Other Ambulatory Visit (HOSPITAL_COMMUNITY): Payer: Self-pay

## 2020-11-29 ENCOUNTER — Other Ambulatory Visit (HOSPITAL_COMMUNITY): Payer: Self-pay

## 2020-11-30 ENCOUNTER — Other Ambulatory Visit (HOSPITAL_COMMUNITY): Payer: Self-pay

## 2020-12-02 ENCOUNTER — Other Ambulatory Visit: Payer: Self-pay

## 2020-12-02 NOTE — Telephone Encounter (Signed)
Pt lvm requesting callback concerning medical transcripts.   Returned patient's call.   He asked previous provider to fax med records to the office to facilitate his Adderall refill.   Advised that without our knowing why the records were being sent the Front Desk would direct the records to the scan center.   I've requested that he has the records sent to our direct fax [phone]). Sending Rx refill request to Dr. Ashley Royalty.

## 2020-12-08 ENCOUNTER — Other Ambulatory Visit (HOSPITAL_COMMUNITY): Payer: Self-pay

## 2020-12-08 MED ORDER — METOPROLOL SUCCINATE ER 25 MG PO TB24
25.0000 mg | ORAL_TABLET | Freq: Every day | ORAL | 1 refills | Status: DC
  Filled 2020-12-08: qty 90, 90d supply, fill #0

## 2020-12-16 ENCOUNTER — Other Ambulatory Visit (HOSPITAL_COMMUNITY): Payer: Self-pay

## 2020-12-22 ENCOUNTER — Ambulatory Visit (INDEPENDENT_AMBULATORY_CARE_PROVIDER_SITE_OTHER): Payer: No Typology Code available for payment source | Admitting: Family Medicine

## 2020-12-22 ENCOUNTER — Other Ambulatory Visit (HOSPITAL_COMMUNITY): Payer: Self-pay

## 2020-12-22 ENCOUNTER — Other Ambulatory Visit: Payer: Self-pay

## 2020-12-22 ENCOUNTER — Encounter: Payer: Self-pay | Admitting: Family Medicine

## 2020-12-22 DIAGNOSIS — M545 Low back pain, unspecified: Secondary | ICD-10-CM

## 2020-12-22 DIAGNOSIS — F902 Attention-deficit hyperactivity disorder, combined type: Secondary | ICD-10-CM | POA: Diagnosis not present

## 2020-12-22 MED ORDER — METHOCARBAMOL 750 MG PO TABS
750.0000 mg | ORAL_TABLET | Freq: Three times a day (TID) | ORAL | 2 refills | Status: DC | PRN
Start: 2020-12-22 — End: 2021-06-02
  Filled 2020-12-22: qty 30, 10d supply, fill #0

## 2020-12-22 MED ORDER — AMPHETAMINE-DEXTROAMPHET ER 25 MG PO CP24
25.0000 mg | ORAL_CAPSULE | ORAL | 0 refills | Status: DC
  Filled 2020-12-22: qty 30, 30d supply, fill #0

## 2020-12-22 MED ORDER — PREDNISONE 10 MG (21) PO TBPK
ORAL_TABLET | ORAL | 2 refills | Status: DC
Start: 2020-12-22 — End: 2021-05-24
  Filled 2020-12-22: qty 21, 6d supply, fill #0

## 2020-12-22 NOTE — Patient Instructions (Signed)
Low Back Sprain or Strain Rehab Ask your health care provider which exercises are safe for you. Do exercises exactly as told by your health care provider and adjust them as directed. It is normal to feel mild stretching, pulling, tightness, or discomfort as you do these exercises. Stop right away if you feel sudden pain or your pain gets worse. Do not begin these exercises until told by your health care provider. Stretching and range-of-motion exercises These exercises warm up your muscles and joints and improve the movement and flexibility of your back. These exercises also help to relieve pain, numbness, and tingling. Lumbar rotation Lie on your back on a firm surface and bend your knees. Straighten your arms out to your sides so each arm forms a 90-degree angle (right angle) with a side of your body. Slowly move (rotate) both of your knees to one side of your body until you feel a stretch in your lower back (lumbar). Try not to let your shoulders lift off the floor. Hold this position for __________ seconds. Tense your abdominal muscles and slowly move your knees back to the starting position. Repeat this exercise on the other side of your body. Repeat __________ times. Complete this exercise __________ times a day.   Single knee to chest Lie on your back on a firm surface with both legs straight. Bend one of your knees. Use your hands to move your knee up toward your chest until you feel a gentle stretch in your lower back and buttock. Hold your leg in this position by holding on to the front of your knee. Keep your other leg as straight as possible. Hold this position for __________ seconds. Slowly return to the starting position. Repeat with your other leg. Repeat __________ times. Complete this exercise __________ times a day.   Prone extension on elbows Lie on your abdomen on a firm surface (prone position). Prop yourself up on your elbows. Use your arms to help lift your chest up  until you feel a gentle stretch in your abdomen and your lower back. This will place some of your body weight on your elbows. If this is uncomfortable, try stacking pillows under your chest. Your hips should stay down, against the surface that you are lying on. Keep your hip and back muscles relaxed. Hold this position for __________ seconds. Slowly relax your upper body and return to the starting position. Repeat __________ times. Complete this exercise __________ times a day.   Strengthening exercises These exercises build strength and endurance in your back. Endurance is the ability to use your muscles for a long time, even after they get tired. Pelvic tilt This exercise strengthens the muscles that lie deep in the abdomen. Lie on your back on a firm surface. Bend your knees and keep your feet flat on the floor. Tense your abdominal muscles. Tip your pelvis up toward the ceiling and flatten your lower back into the floor. To help with this exercise, you may place a small towel under your lower back and try to push your back into the towel. Hold this position for __________ seconds. Let your muscles relax completely before you repeat this exercise. Repeat __________ times. Complete this exercise __________ times a day. Alternating arm and leg raises Get on your hands and knees on a firm surface. If you are on a hard floor, you may want to use padding, such as an exercise mat, to cushion your knees. Line up your arms and legs. Your hands should be  directly below your shoulders, and your knees should be directly below your hips. Lift your left leg behind you. At the same time, raise your right arm and straighten it in front of you. Do not lift your leg higher than your hip. Do not lift your arm higher than your shoulder. Keep your abdominal and back muscles tight. Keep your hips facing the ground. Do not arch your back. Keep your balance carefully, and do not hold your breath. Hold this  position for __________ seconds. Slowly return to the starting position. Repeat with your right leg and your left arm. Repeat __________ times. Complete this exercise __________ times a day.   Abdominal set with straight leg raise Lie on your back on a firm surface. Bend one of your knees and keep your other leg straight. Tense your abdominal muscles and lift your straight leg up, 4-6 inches (10-15 cm) off the ground. Keep your abdominal muscles tight and hold this position for __________ seconds. Do not hold your breath. Do not arch your back. Keep it flat against the ground. Keep your abdominal muscles tense as you slowly lower your leg back to the starting position. Repeat with your other leg. Repeat __________ times. Complete this exercise __________ times a day.   Single leg lower with bent knees Lie on your back on a firm surface. Tense your abdominal muscles and lift your feet off the floor, one foot at a time, so your knees and hips are bent in 90-degree angles (right angles). Your knees should be over your hips and your lower legs should be parallel to the floor. Keeping your abdominal muscles tense and your knee bent, slowly lower one of your legs so your toe touches the ground. Lift your leg back up to return to the starting position. Do not hold your breath. Do not let your back arch. Keep your back flat against the ground. Repeat with your other leg. Repeat __________ times. Complete this exercise __________ times a day. Posture and body mechanics Good posture and healthy body mechanics can help to relieve stress in your body's tissues and joints. Body mechanics refers to the movements and positions of your body while you do your daily activities. Posture is part of body mechanics. Good posture means: Your spine is in its natural S-curve position (neutral). Your shoulders are pulled back slightly. Your head is not tipped forward. Follow these guidelines to improve your  posture and body mechanics in your everyday activities. Standing When standing, keep your spine neutral and your feet about hip width apart. Keep a slight bend in your knees. Your ears, shoulders, and hips should line up. When you do a task in which you stand in one place for a long time, place one foot up on a stable object that is 2-4 inches (5-10 cm) high, such as a footstool. This helps keep your spine neutral.   Sitting When sitting, keep your spine neutral and keep your feet flat on the floor. Use a footrest, if necessary, and keep your thighs parallel to the floor. Avoid rounding your shoulders, and avoid tilting your head forward. When working at a desk or a computer, keep your desk at a height where your hands are slightly lower than your elbows. Slide your chair under your desk so you are close enough to maintain good posture. When working at a computer, place your monitor at a height where you are looking straight ahead and you do not have to tilt your head forward or  downward to look at the screen.   Resting When lying down and resting, avoid positions that are most painful for you. If you have pain with activities such as sitting, bending, stooping, or squatting, lie in a position in which your body does not bend very much. For example, avoid curling up on your side with your arms and knees near your chest (fetal position). If you have pain with activities such as standing for a long time or reaching with your arms, lie with your spine in a neutral position and bend your knees slightly. Try the following positions: Lying on your side with a pillow between your knees. Lying on your back with a pillow under your knees. Lifting When lifting objects, keep your feet at least shoulder width apart and tighten your abdominal muscles. Bend your knees and hips and keep your spine neutral. It is important to lift using the strength of your legs, not your back. Do not lock your knees straight  out. Always ask for help to lift heavy or awkward objects.   This information is not intended to replace advice given to you by your health care provider. Make sure you discuss any questions you have with your health care provider. Document Revised: 10/24/2018 Document Reviewed: 07/24/2018 Elsevier Patient Education  2021 [employer].

## 2020-12-25 DIAGNOSIS — M545 Low back pain, unspecified: Secondary | ICD-10-CM | POA: Insufficient documentation

## 2020-12-25 DIAGNOSIS — F988 Other specified behavioral and emotional disorders with onset usually occurring in childhood and adolescence: Secondary | ICD-10-CM | POA: Insufficient documentation

## 2020-12-25 NOTE — Assessment & Plan Note (Signed)
Adderall XR renewed.  F/u in 3 months.

## 2020-12-25 NOTE — Assessment & Plan Note (Signed)
Start prednisone taper and robaxin.  HEP given, if not improving with this will add formal PT.

## 2020-12-25 NOTE — Progress Notes (Signed)
Glenn Schneider - 30 y.o. male MRN [id number]  Date of birth: 1990-11-01  Subjective Chief Complaint  Patient presents with   Back Pain    HPI Glenn Schneider is a 30 y.o. male here today with complaint of back pain.  Pain is located in lower back and he has this from time to time.  Prednisone and robaxin as worked well for him previously.  Pain is non-radicular.  He denies weakness in lower extremities.    He also needs renewal of adderall xr.  He has done well with current dose of 25 mg daily.  No side effects at this time.    ROS:  A comprehensive ROS was completed and negative except as noted per HPI  Allergies  Allergen Reactions   Bee Venom Anaphylaxis   Pineapple Anaphylaxis   Toradol [Ketorolac Tromethamine] Hives and Itching    Past Medical History:  Diagnosis Date   Chronic back pain greater than 3 months duration    Herniated disc    PVC's (premature ventricular contractions)    SVT (supraventricular tachycardia) (HCC)     History reviewed. No pertinent surgical history.  Social History   Socioeconomic History   Marital status: Married    Spouse name: Not on file   Number of children: Not on file   Years of education: Not on file   Highest education level: Not on file  Occupational History   Not on file  Tobacco Use   Smoking status: Former    Pack years: 0.00    Types: Cigarettes    Quit date: 07/16/2009    Years since quitting: 11.4   Smokeless tobacco: Former  Building services engineer Use: Never used  Substance and Sexual Activity   Alcohol use: Yes   Drug use: No   Sexual activity: Yes    Partners: Female  Other Topics Concern   Not on file  Social History Narrative   Not on file   Social Determinants of Health   Financial Resource Strain: Not on file  Food Insecurity: Not on file  Transportation Needs: Not on file  Physical Activity: Not on file  Stress: Not on file  Social Connections: Not on file    Family History  Problem Relation Age of Onset    Hypertension Mother    Hypertension Father    Heart attack Father     Health Maintenance  Topic Date Due   HIV Screening  Never done   Hepatitis C Screening  Never done   COVID-19 Vaccine (3 - Booster for Pfizer series) 09/12/2020   INFLUENZA VACCINE  02/13/2021   TETANUS/TDAP  02/23/2029   Zoster Vaccines- Shingrix (1 of 2) 02/21/2039   Pneumococcal Vaccine 60-39 Years old  Aged Out   HPV VACCINES  Aged Out     ----------------------------------------------------------------------------------------------------------------------------------------------------------------------------------------------------------------- Physical Exam BP 122/75 (BP Location: Left Arm, Patient Position: Sitting)   Pulse 82   Temp 98.3 F (36.8 C)   Ht 6\' 1"  (1.854 m)   Wt 232 lb (105.2 kg)   SpO2 98%   BMI 30.61 kg/m   Physical Exam Constitutional:      Appearance: Normal appearance.  Cardiovascular:     Rate and Rhythm: Normal rate and regular rhythm.  Pulmonary:     Effort: Pulmonary effort is normal.     Breath sounds: Normal breath sounds.  Musculoskeletal:        General: Normal range of motion.     Cervical back: Neck supple.  Skin:  General: Skin is warm and dry.  Neurological:     General: No focal deficit present.     Mental Status: He is alert.    ------------------------------------------------------------------------------------------------------------------------------------------------------------------------------------------------------------------- Assessment and Plan  Low back pain Start prednisone taper and robaxin.  HEP given, if not improving with this will add formal PT.   ADD (attention deficit disorder) Adderall XR renewed.  F/u in 3 months.    Meds ordered this encounter  Medications   predniSONE (STERAPRED UNI-PAK 21 TAB) 10 MG (21) TBPK tablet    Sig: Taper as directed on packaging    Dispense:  21 tablet    Refill:  2   methocarbamol  (ROBAXIN-750) 750 MG tablet    Sig: Take 1 tablet (750 mg total) by mouth every 8 (eight) hours as needed for muscle spasms.    Dispense:  30 tablet    Refill:  2   amphetamine-dextroamphetamine (ADDERALL XR) 25 MG 24 hr capsule    Sig: Take 1 capsule by mouth every morning.    Dispense:  30 capsule    Refill:  0    No follow-ups on file.    This visit occurred during the SARS-CoV-2 public health emergency.  Safety protocols were in place, including screening questions prior to the visit, additional usage of staff PPE, and extensive cleaning of exam room while observing appropriate contact time as indicated for disinfecting solutions.

## 2021-01-19 ENCOUNTER — Other Ambulatory Visit: Payer: Self-pay

## 2021-01-19 MED ORDER — AMPHETAMINE-DEXTROAMPHET ER 25 MG PO CP24: 25.0000 mg | ORAL_CAPSULE | ORAL | 0 refills | Status: DC

## 2021-01-19 NOTE — Progress Notes (Signed)
Rx updated.

## 2021-01-20 NOTE — Progress Notes (Signed)
LVM advising patient of updated Rx and change of pharmacy to [facility] per request.

## 2021-02-16 ENCOUNTER — Telehealth: Payer: Self-pay

## 2021-02-16 NOTE — Telephone Encounter (Signed)
Pt lvm stating he dropped a washing machine on his foot a few days ago. Unsure if we take his insurance or if they will pay for the visit. Wants to know before he schedules.   Please follow-up with patient. Thanks

## 2021-02-17 NOTE — Telephone Encounter (Signed)
We don't know about billing or what is covered, this is all through insurance and our billing department. Pts are responsible for ensuring coverage is in network or accepted at this facility. Pt informed of this

## 2021-05-08 ENCOUNTER — Other Ambulatory Visit: Payer: Self-pay

## 2021-05-09 NOTE — Progress Notes (Signed)
Please contact the patient for clarification. Find out if he has another PCP. If not, he needs to schedule a follow-up appt with Dr. Ashley Royalty. Thanks

## 2021-05-09 NOTE — Progress Notes (Signed)
Patient stated he is still a patient of Dr Ashley Royalty, that he was seen at the [facility] through [facility] as that is the closest to him. Scheduled an appt for November 9th with Dr Ashley Royalty. AM

## 2021-05-24 ENCOUNTER — Ambulatory Visit: Payer: No Typology Code available for payment source | Admitting: Family Medicine

## 2021-06-02 ENCOUNTER — Ambulatory Visit (INDEPENDENT_AMBULATORY_CARE_PROVIDER_SITE_OTHER): Payer: No Typology Code available for payment source | Admitting: Family Medicine

## 2021-06-02 ENCOUNTER — Encounter: Payer: Self-pay | Admitting: Family Medicine

## 2021-06-02 ENCOUNTER — Other Ambulatory Visit (HOSPITAL_COMMUNITY): Payer: Self-pay

## 2021-06-02 ENCOUNTER — Other Ambulatory Visit: Payer: Self-pay

## 2021-06-02 DIAGNOSIS — G8929 Other chronic pain: Secondary | ICD-10-CM | POA: Diagnosis not present

## 2021-06-02 DIAGNOSIS — M25511 Pain in right shoulder: Secondary | ICD-10-CM | POA: Diagnosis not present

## 2021-06-02 DIAGNOSIS — F902 Attention-deficit hyperactivity disorder, combined type: Secondary | ICD-10-CM | POA: Diagnosis not present

## 2021-06-02 DIAGNOSIS — M25562 Pain in left knee: Secondary | ICD-10-CM

## 2021-06-02 MED ORDER — AMPHETAMINE-DEXTROAMPHET ER 25 MG PO CP24
25.0000 mg | ORAL_CAPSULE | ORAL | 0 refills | Status: DC
  Filled 2021-06-02: qty 90, 90d supply, fill #0

## 2021-06-02 MED ORDER — PREDNISONE 50 MG PO TABS
ORAL_TABLET | ORAL | 0 refills | Status: DC
  Filled 2021-06-02: qty 5, fill #0

## 2021-06-02 MED ORDER — METHOCARBAMOL 750 MG PO TABS
750.0000 mg | ORAL_TABLET | Freq: Three times a day (TID) | ORAL | 2 refills | Status: AC | PRN
  Filled 2021-06-02: qty 30, 10d supply, fill #0
  Filled 2022-04-27: qty 30, 10d supply, fill #1

## 2021-06-02 MED ORDER — METOPROLOL SUCCINATE ER 25 MG PO TB24
25.0000 mg | ORAL_TABLET | Freq: Every day | ORAL | 1 refills | Status: DC
  Filled 2021-06-02: qty 90, 90d supply, fill #0

## 2021-06-04 DIAGNOSIS — M25511 Pain in right shoulder: Secondary | ICD-10-CM | POA: Insufficient documentation

## 2021-06-04 NOTE — Assessment & Plan Note (Signed)
Left knee with likely MCL tear.  He does plan to follow-up with orthopedics for this.  He would like to hold off on MRI for now.

## 2021-06-04 NOTE — Assessment & Plan Note (Signed)
No significant weakness on exam.  Likely strain/tendinitis of the right rotator cuff.  Subacromial injection given today, see procedure note.
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
## 2021-06-04 NOTE — Assessment & Plan Note (Signed)
Adderall XR renewed.  We will plan to have him follow-up in 3 months for this.

## 2021-08-24 ENCOUNTER — Ambulatory Visit (INDEPENDENT_AMBULATORY_CARE_PROVIDER_SITE_OTHER): Payer: Self-pay | Admitting: Family Medicine

## 2021-08-24 ENCOUNTER — Encounter: Payer: Self-pay | Admitting: Family Medicine

## 2021-08-24 ENCOUNTER — Other Ambulatory Visit: Payer: Self-pay

## 2021-08-24 DIAGNOSIS — R002 Palpitations: Secondary | ICD-10-CM

## 2021-08-24 DIAGNOSIS — F902 Attention-deficit hyperactivity disorder, combined type: Secondary | ICD-10-CM

## 2021-08-24 MED ORDER — METOPROLOL SUCCINATE ER 25 MG PO TB24: 25.0000 mg | ORAL_TABLET | Freq: Every day | ORAL | 1 refills | Status: AC

## 2021-08-24 MED ORDER — AMPHETAMINE-DEXTROAMPHET ER 25 MG PO CP24
25.0000 mg | ORAL_CAPSULE | ORAL | 0 refills | Status: DC
Start: 2021-08-24 — End: 2021-12-25

## 2021-08-24 NOTE — Assessment & Plan Note (Signed)
Doing well with Toprol at current strength.  He has cut back slightly on his caffeine intake.

## 2021-08-24 NOTE — Progress Notes (Signed)
Glenn Schneider - 31 y.o. male MRN [id number]  Date of birth: February 10, 1991  Subjective No chief complaint on file.   HPI Glenn Schneider is a 31 year old male here today for follow-up visit.  Reports overall he is doing well.  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  Palpitations have improved significantly with addition of metoprolol.  No side effects noted with this.  Continues to do well with current dose of Adderall XR.  Denies side effects including insomnia, increased anxiety or insomnia.  ROS:  A comprehensive ROS was completed and negative except as noted per HPI  Allergies  Allergen Reactions   Bee Venom Anaphylaxis   Pineapple Anaphylaxis   Toradol [Ketorolac Tromethamine] Hives and Itching    Past Medical History:  Diagnosis Date   Chronic back pain greater than 3 months duration    Herniated disc    PVC's (premature ventricular contractions)    SVT (supraventricular tachycardia) (HCC)     History reviewed. No pertinent surgical history.  Social History   Socioeconomic History   Marital status: Married    Spouse name: Not on file   Number of children: Not on file   Years of education: Not on file   Highest education level: Not on file  Occupational History   Not on file  Tobacco Use   Smoking status: Former    Types: Cigarettes    Quit date: 07/16/2009    Years since quitting: 12.1   Smokeless tobacco: Former  Building services engineer Use: Never used  Substance and Sexual Activity   Alcohol use: Yes   Drug use: No   Sexual activity: Yes    Partners: Female  Other Topics Concern   Not on file  Social History Narrative   Not on file   Social Determinants of Health   Financial Resource Strain: Not on file  Food Insecurity: Not on file  Transportation Needs: Not on file  Physical Activity: Not on file  Stress: Not on file  Social Connections: Not on file    Family History  Problem Relation Age of Onset   Hypertension Mother     Hypertension Father    Heart attack Father     Health Maintenance  Topic Date Due   HIV Screening  Never done   Hepatitis C Screening  Never done   COVID-19 Vaccine (3 - Booster for [employer] series) 09/09/2022 (Originally 06/07/2020)   TETANUS/TDAP  02/23/2029   INFLUENZA VACCINE  Completed   HPV VACCINES  Aged Out     ----------------------------------------------------------------------------------------------------------------------------------------------------------------------------------------------------------------- Physical Exam BP 125/81 (BP Location: Left Arm, Patient Position: Sitting, Cuff Size: Large)    Pulse 92    Temp 97.7 F (36.5 C)    Ht 6\' 1"  (1.854 m)    Wt 234 lb (106.1 kg)    SpO2 100%    BMI 30.87 kg/m   Physical Exam Constitutional:      Appearance: Normal appearance.  Eyes:     General: No scleral icterus. Cardiovascular:     Rate and Rhythm: Normal rate and regular rhythm.  Pulmonary:     Effort: Pulmonary effort is normal.     Breath sounds: Normal breath sounds.  Musculoskeletal:     Cervical back: Neck supple.  Neurological:     Mental Status: He is alert.  Psychiatric:        Mood and Affect: Mood normal.        Behavior: Behavior normal.    -------------------------------------------------------------------------------------------------------------------------------------------------------------------------------------------------------------------  Assessment and Plan  Palpitations Doing well with Toprol at current strength.  He has cut back slightly on his caffeine intake.  ADD (attention deficit disorder) Symptoms remain well controlled with current dose of Adderall XR.  Recommend continuation at current strength.   Meds ordered this encounter  Medications   amphetamine-dextroamphetamine (ADDERALL XR) 25 MG 24 hr capsule    Sig: Take 1 capsule by mouth every morning.    Dispense:  90 capsule    Refill:  0   metoprolol succinate  (TOPROL-XL) 25 MG 24 hr tablet    Sig: Take 1 tablet (25 mg total) by mouth daily.    Dispense:  90 tablet    Refill:  1    Return in about 6 months (around 02/21/2022) for ADHD.    This visit occurred during the SARS-CoV-2 public health emergency.  Safety protocols were in place, including screening questions prior to the visit, additional usage of staff PPE, and extensive cleaning of exam room while observing appropriate contact time as indicated for disinfecting solutions.

## 2021-08-24 NOTE — Assessment & Plan Note (Signed)
Symptoms remain well controlled with current dose of Adderall XR.  Recommend continuation at current strength.

## 2021-12-25 ENCOUNTER — Other Ambulatory Visit: Payer: Self-pay

## 2021-12-25 ENCOUNTER — Other Ambulatory Visit (HOSPITAL_COMMUNITY): Payer: Self-pay

## 2021-12-25 MED ORDER — AMPHETAMINE-DEXTROAMPHET ER 25 MG PO CP24
25.0000 mg | ORAL_CAPSULE | ORAL | 0 refills | Status: AC
  Filled 2021-12-25 (×2): qty 90, 90d supply, fill #0

## 2022-02-23 ENCOUNTER — Ambulatory Visit: Payer: Self-pay | Admitting: Family Medicine

## 2022-04-08 IMAGING — DX DG CHEST 2V
2 series · 2 of 2 positions shown · non-contrast
Comparison: Chest radiograph dated 07/28/2014.

CLINICAL DATA: 28-year-old male with chest pain.

EXAM:
CHEST - 2 VIEW

[chest pa]
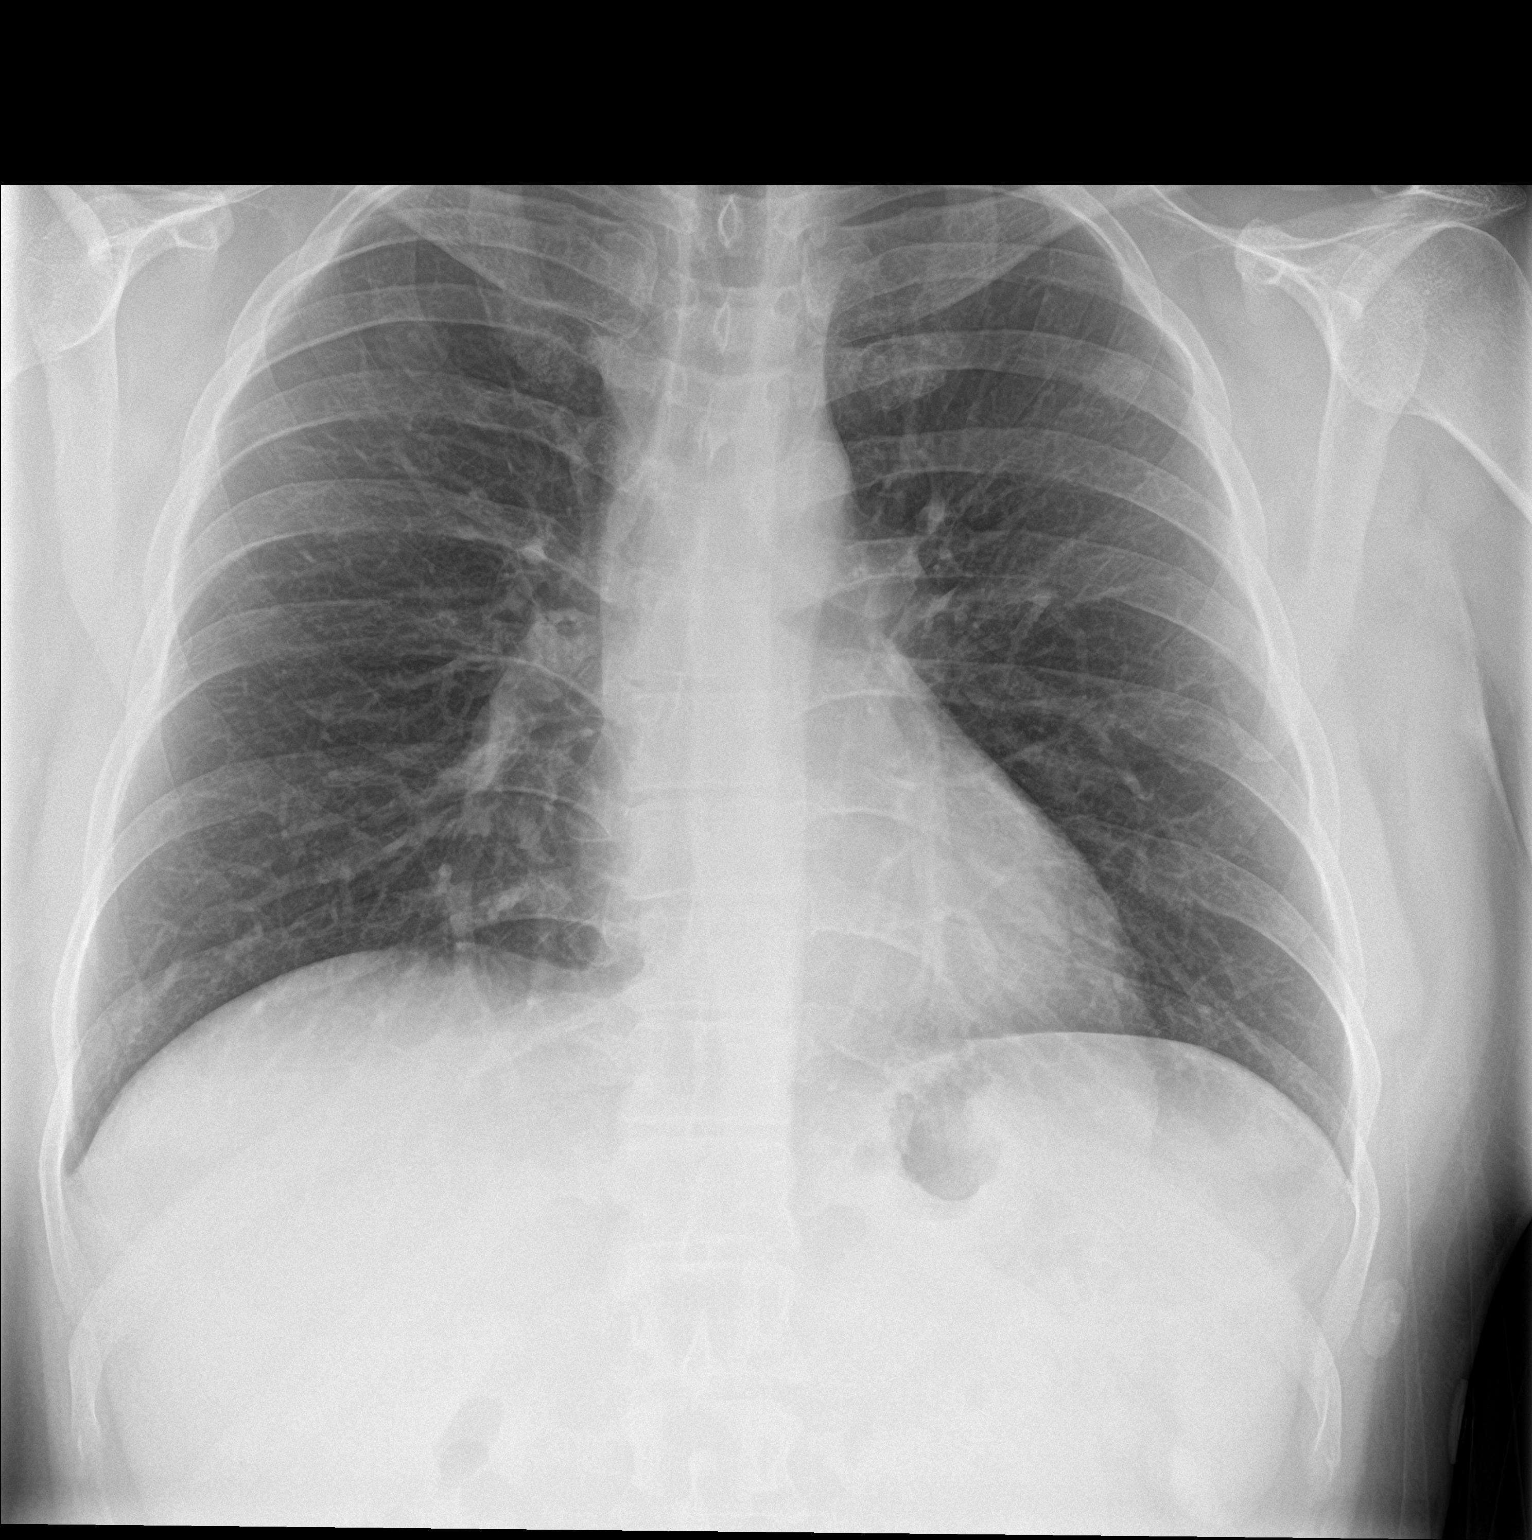

[chest lat]
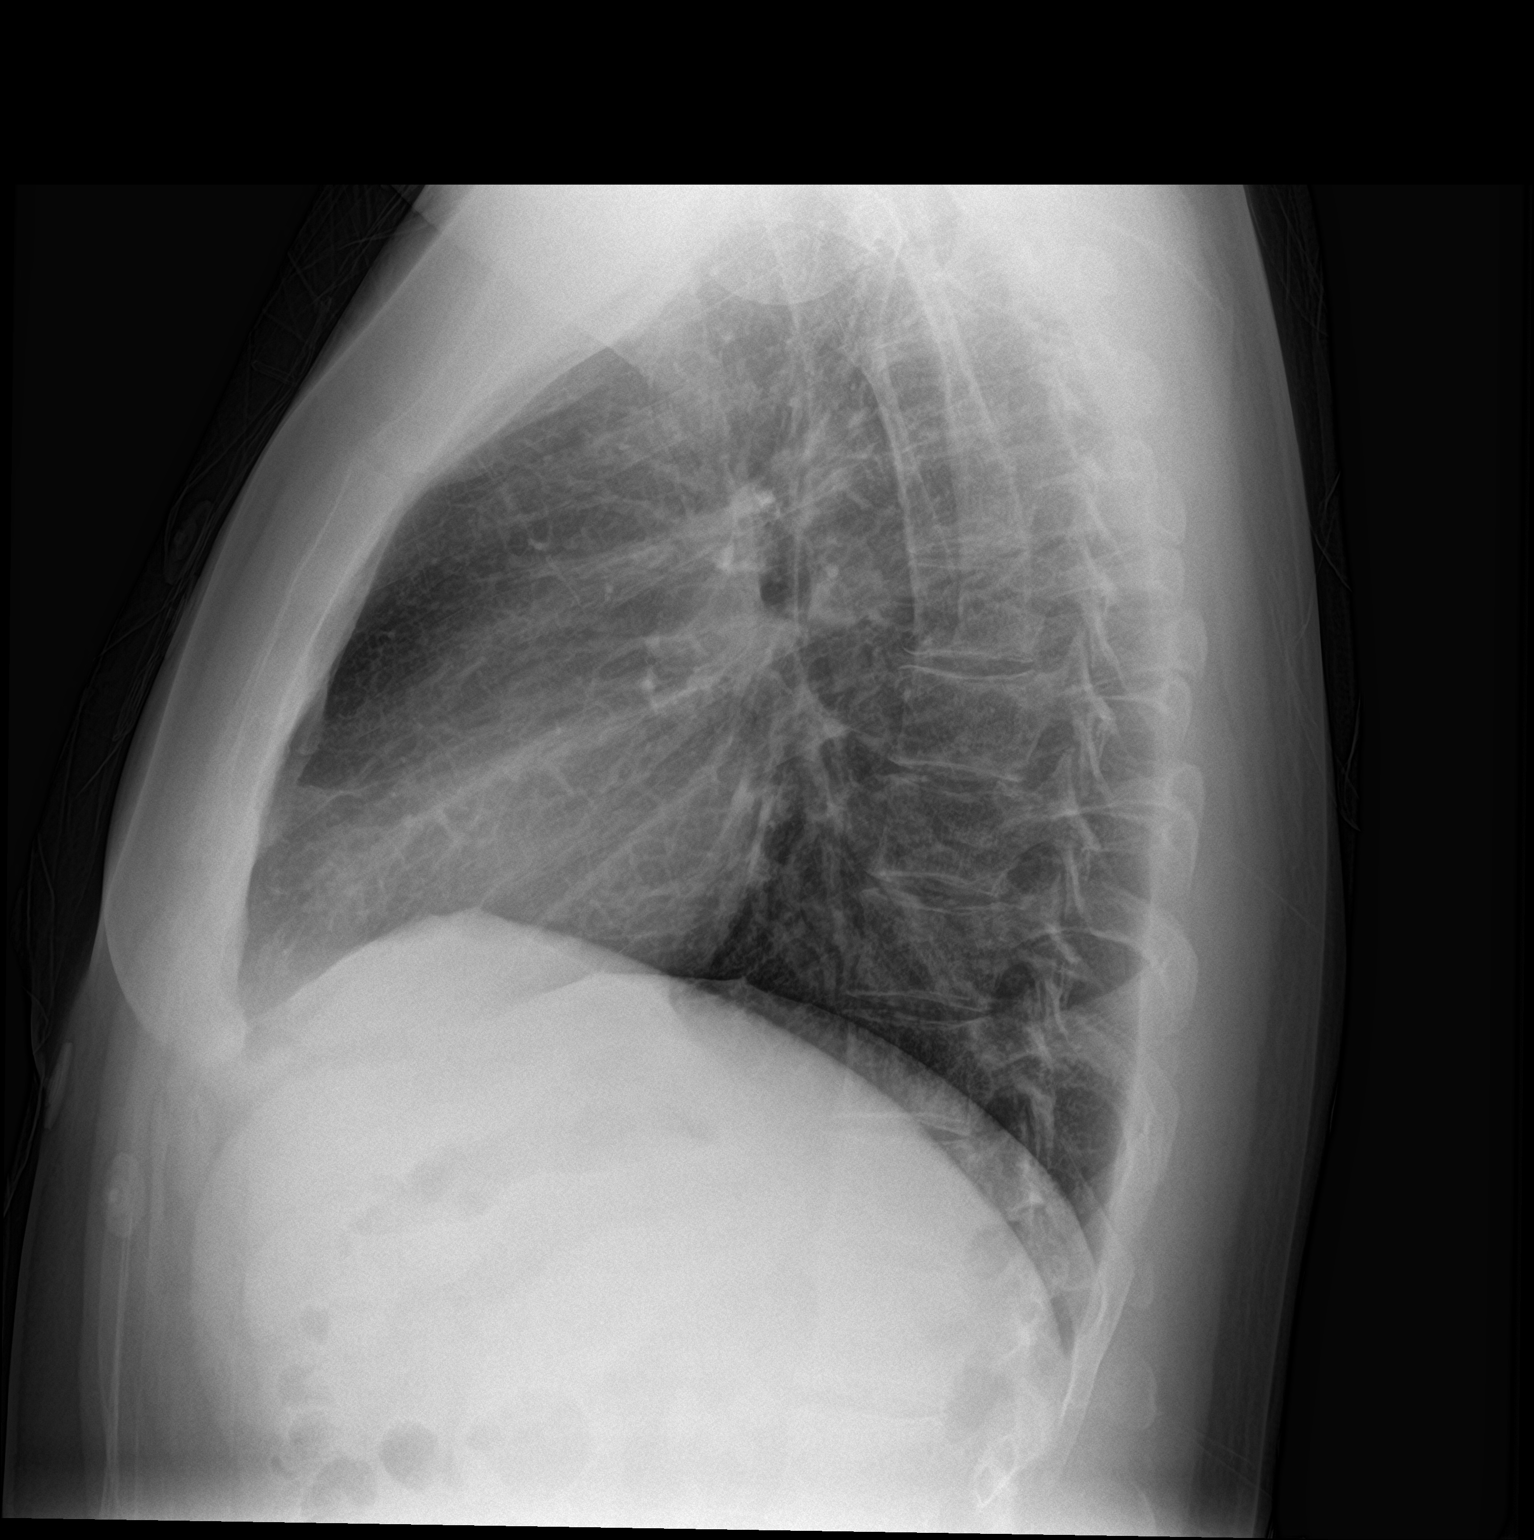

[2 of 2 positions shown; findings below may reference images not displayed]

FINDINGS: The heart size and mediastinal contours are within normal limits.
Both lungs are clear. The visualized skeletal structures are
unremarkable.
IMPRESSION: No active cardiopulmonary disease.

## 2022-04-13 ENCOUNTER — Other Ambulatory Visit: Payer: Self-pay

## 2022-04-13 DIAGNOSIS — F902 Attention-deficit hyperactivity disorder, combined type: Secondary | ICD-10-CM

## 2022-04-16 NOTE — Progress Notes (Signed)
I've called several times. There is no voicemail. I can't leave a message. Pt only has a home  phone listed. Tvt

## 2022-04-26 ENCOUNTER — Other Ambulatory Visit: Payer: Self-pay

## 2022-04-27 ENCOUNTER — Other Ambulatory Visit: Payer: Self-pay | Admitting: Family Medicine

## 2022-04-27 ENCOUNTER — Other Ambulatory Visit (HOSPITAL_COMMUNITY): Payer: Self-pay

## 2022-04-27 NOTE — Telephone Encounter (Signed)
Phone number doesn't have a voicemail to leave a message.  tvt

## 2022-05-01 ENCOUNTER — Encounter: Payer: Self-pay | Admitting: Family Medicine

## 2022-05-01 ENCOUNTER — Other Ambulatory Visit (HOSPITAL_COMMUNITY): Payer: Self-pay

## 2022-05-01 ENCOUNTER — Ambulatory Visit: Payer: Self-pay | Admitting: Family Medicine

## 2022-05-01 ENCOUNTER — Other Ambulatory Visit: Payer: Self-pay | Admitting: Family Medicine

## 2022-05-07 ENCOUNTER — Other Ambulatory Visit (HOSPITAL_COMMUNITY): Payer: Self-pay
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
## 2023-01-22 ENCOUNTER — Telehealth: Payer: Self-pay | Admitting: General Practice

## 2023-01-22 NOTE — Transitions of Care (Post Inpatient/ED Visit) (Signed)
   01/22/2023  Name: Glenn Schneider MRN: [id number] DOB: [birth year]  Today's TOC FU Call Status: Today's TOC FU Call Status:: Unsuccessul Call (1st Attempt) Unsuccessful Call (1st Attempt) Date: 01/22/23  Attempted to reach the patient regarding the most recent Inpatient/ED visit.  Follow Up Plan: Additional outreach attempts will be made to reach the patient to complete the Transitions of Care (Post Inpatient/ED visit) call.   Signature Modesto Charon, Control and instrumentation engineer
